# Patient Record
Sex: Female | Born: 1970 | Race: White | Hispanic: No | Smoking: Never smoker
Health system: Southern US, Community
[De-identification: ages and names within clinical notes are randomized; demographics above are authoritative.]

## PROBLEM LIST (undated history)

## (undated) DIAGNOSIS — F419 Anxiety disorder, unspecified: Secondary | ICD-10-CM

## (undated) DIAGNOSIS — F329 Major depressive disorder, single episode, unspecified: Secondary | ICD-10-CM

## (undated) DIAGNOSIS — N809 Endometriosis, unspecified: Secondary | ICD-10-CM

## (undated) DIAGNOSIS — F32A Depression, unspecified: Secondary | ICD-10-CM

## (undated) HISTORY — PX: PELVIC LAPAROSCOPY: SHX162

## (undated) HISTORY — DX: Anxiety disorder, unspecified: F41.9

## (undated) HISTORY — PX: TONSILLECTOMY AND ADENOIDECTOMY: SHX28

## (undated) HISTORY — DX: Depression, unspecified: F32.A

## (undated) HISTORY — DX: Endometriosis, unspecified: N80.9

## (undated) HISTORY — DX: Major depressive disorder, single episode, unspecified: F32.9

---

## 1998-04-16 ENCOUNTER — Other Ambulatory Visit: Admission: RE | Admit: 1998-04-16 | Discharge: 1998-04-16 | Payer: Self-pay | Admitting: Gynecology

## 1998-05-12 ENCOUNTER — Inpatient Hospital Stay (HOSPITAL_COMMUNITY): Admission: AD | Admit: 1998-05-12 | Discharge: 1998-05-12 | Payer: Self-pay | Admitting: Obstetrics and Gynecology

## 1998-05-14 ENCOUNTER — Encounter: Admission: RE | Admit: 1998-05-14 | Discharge: 1998-08-12 | Payer: Self-pay | Admitting: Gynecology

## 1998-08-12 ENCOUNTER — Other Ambulatory Visit: Admission: RE | Admit: 1998-08-12 | Discharge: 1998-08-12 | Payer: Self-pay | Admitting: Gynecology

## 1998-09-18 ENCOUNTER — Inpatient Hospital Stay (HOSPITAL_COMMUNITY): Admission: AD | Admit: 1998-09-18 | Discharge: 1998-09-18 | Payer: Self-pay | Admitting: Obstetrics and Gynecology

## 1998-10-23 ENCOUNTER — Inpatient Hospital Stay (HOSPITAL_COMMUNITY): Admission: AD | Admit: 1998-10-23 | Discharge: 1998-10-23 | Payer: Self-pay | Admitting: Obstetrics and Gynecology

## 1998-10-26 ENCOUNTER — Inpatient Hospital Stay (HOSPITAL_COMMUNITY): Admission: AD | Admit: 1998-10-26 | Discharge: 1998-10-28 | Payer: Self-pay | Admitting: Gynecology

## 1998-12-23 ENCOUNTER — Other Ambulatory Visit: Admission: RE | Admit: 1998-12-23 | Discharge: 1998-12-23 | Payer: Self-pay | Admitting: Gynecology

## 1999-12-25 ENCOUNTER — Other Ambulatory Visit: Admission: RE | Admit: 1999-12-25 | Discharge: 1999-12-25 | Payer: Self-pay | Admitting: Obstetrics and Gynecology

## 2001-05-17 ENCOUNTER — Other Ambulatory Visit: Admission: RE | Admit: 2001-05-17 | Discharge: 2001-05-17 | Payer: Self-pay | Admitting: Obstetrics and Gynecology

## 2002-06-26 ENCOUNTER — Other Ambulatory Visit: Admission: RE | Admit: 2002-06-26 | Discharge: 2002-06-26 | Payer: Self-pay | Admitting: Obstetrics and Gynecology

## 2003-12-12 ENCOUNTER — Other Ambulatory Visit: Admission: RE | Admit: 2003-12-12 | Discharge: 2003-12-12 | Payer: Self-pay | Admitting: Obstetrics and Gynecology

## 2007-06-30 ENCOUNTER — Other Ambulatory Visit: Admission: RE | Admit: 2007-06-30 | Discharge: 2007-06-30 | Payer: Self-pay | Admitting: Obstetrics and Gynecology

## 2013-11-13 ENCOUNTER — Ambulatory Visit (INDEPENDENT_AMBULATORY_CARE_PROVIDER_SITE_OTHER): Payer: BC Managed Care – PPO | Admitting: Women's Health

## 2013-11-13 ENCOUNTER — Encounter: Payer: Self-pay | Admitting: Women's Health

## 2013-11-13 DIAGNOSIS — Z113 Encounter for screening for infections with a predominantly sexual mode of transmission: Secondary | ICD-10-CM

## 2013-11-13 DIAGNOSIS — A499 Bacterial infection, unspecified: Secondary | ICD-10-CM

## 2013-11-13 DIAGNOSIS — B9689 Other specified bacterial agents as the cause of diseases classified elsewhere: Secondary | ICD-10-CM

## 2013-11-13 DIAGNOSIS — N760 Acute vaginitis: Secondary | ICD-10-CM

## 2013-11-13 LAB — WET PREP FOR TRICH, YEAST, CLUE
TRICH WET PREP: NONE SEEN
YEAST WET PREP: NONE SEEN

## 2013-11-13 MED ORDER — METRONIDAZOLE 0.75 % VA GEL: VAGINAL | Status: DC

## 2013-11-13 NOTE — Patient Instructions (Signed)
Bacterial Vaginosis Bacterial vaginosis is an infection of the vagina. It happens when too many of certain germs (bacteria) grow in the vagina. HOME CARE  Take your medicine as told by your doctor.  Finish your medicine even if you start to feel better.  Do not have sex until you finish your medicine and are better.  Tell your sex partner that you have an infection. They should see their doctor for treatment.  Practice safe sex. Use condoms. Have only one sex partner. GET HELP IF:  You are not getting better after 3 days of treatment.  You have more grey fluid (discharge) coming from your vagina than before.  You have more pain than before.  You have a fever. MAKE SURE YOU:   Understand these instructions.  Will watch your condition.  Will get help right away if you are not doing well or get worse. Document Released: 06/01/2008 Document Revised: 06/13/2013 Document Reviewed: 04/04/2013 Exit[employer] Patient Information 2014 [employer].  

## 2013-11-13 NOTE — Progress Notes (Signed)
Patient ID: Lori Serrano, female   DOB: 12-26-1970, 43 y.o.   MRN: [id number] Presents with complaint of painful bump on right inner labia draining bloody material. States had one several weeks ago that resolved on its own. Questions if it's a Bartholin's cyst. One partner greater than 20 years, brief separation, denies abuse or infidelity. Denies urinary symptoms, abdominal pain or fever. Reports normal Pap history, last Pap greater than 3 years ago. Has a  scheduled mammogram and annual exam next week.  Exam: Appears well. External genitalia slightly erythematous, right inner labia folliculitis draining serous sanguinous drainage no odor noted. Speculum exam moderate amount of adherent white discharge with odor noted. Wet prep positive for amines, clues, TNTC bacteria. GC/Chlamydia culture taken. Bimanual no CMT or adnexal fullness or tenderness.  Bacteria vaginosis Resolving folliculitis  Plan: MetroGel vaginal cream 1 applicator at bedtime x5, alcohol precautions reviewed. Loose clothing, soak in a warm tub, topical antibiotic cream twice daily to affected area. Keep scheduled mammogram and annual exam appointment, will check HIV, hepatitis and RPR. Encouraged marriage counseling, Sonda Primes name and number given.

## 2013-11-14 NOTE — Addendum Note (Signed)
Addended by: Su Grand A on: 11/14/2013 08:39 AM   Modules accepted: Orders

## 2013-11-15 LAB — GC/CHLAMYDIA PROBE AMP
CT PROBE, AMP APTIMA: NEGATIVE
GC Probe RNA: NEGATIVE

## 2013-11-23 ENCOUNTER — Encounter: Payer: Self-pay | Admitting: Women's Health

## 2013-11-23 ENCOUNTER — Ambulatory Visit (INDEPENDENT_AMBULATORY_CARE_PROVIDER_SITE_OTHER): Payer: BC Managed Care – PPO | Admitting: Women's Health

## 2013-11-23 ENCOUNTER — Other Ambulatory Visit (HOSPITAL_COMMUNITY)
Admission: RE | Admit: 2013-11-23 | Discharge: 2013-11-23 | Disposition: A | Discharge status: Home / Self Care | Payer: BC Managed Care – PPO | Source: Ambulatory Visit | Attending: Women's Health | Admitting: Women's Health

## 2013-11-23 VITALS — BP 124/80 | Ht 65.0 in | Wt 156.4 lb

## 2013-11-23 DIAGNOSIS — N83209 Unspecified ovarian cyst, unspecified side: Secondary | ICD-10-CM

## 2013-11-23 DIAGNOSIS — Z833 Family history of diabetes mellitus: Secondary | ICD-10-CM

## 2013-11-23 DIAGNOSIS — N809 Endometriosis, unspecified: Secondary | ICD-10-CM | POA: Insufficient documentation

## 2013-11-23 DIAGNOSIS — E079 Disorder of thyroid, unspecified: Secondary | ICD-10-CM

## 2013-11-23 DIAGNOSIS — Z1151 Encounter for screening for human papillomavirus (HPV): Secondary | ICD-10-CM | POA: Insufficient documentation

## 2013-11-23 DIAGNOSIS — Z01419 Encounter for gynecological examination (general) (routine) without abnormal findings: Secondary | ICD-10-CM | POA: Insufficient documentation

## 2013-11-23 DIAGNOSIS — Z13220 Encounter for screening for lipoid disorders: Secondary | ICD-10-CM

## 2013-11-23 LAB — LIPID PANEL
Cholesterol: 185 mg/dL (ref 0–200)
HDL: 42 mg/dL (ref >39)
LDL CALC: 114 mg/dL — AB (ref 0–99)
Total CHOL/HDL Ratio: 4.4 Ratio
Triglycerides: 147 mg/dL (ref <150)
VLDL: 29 mg/dL (ref 0–40)

## 2013-11-23 LAB — CBC WITH DIFFERENTIAL/PLATELET
BASOS PCT: 1 % (ref 0–1)
Basophils Absolute: 0.1 10*3/uL (ref 0.0–0.1)
EOS ABS: 0.1 10*3/uL (ref 0.0–0.7)
EOS PCT: 1 % (ref 0–5)
HEMATOCRIT: 38.7 % (ref 36.0–46.0)
Hemoglobin: 13.8 g/dL (ref 12.0–15.0)
Lymphocytes Relative: 36 % (ref 12–46)
Lymphs Abs: 2.9 10*3/uL (ref 0.7–4.0)
MCH: 29.7 pg (ref 26.0–34.0)
MCHC: 35.7 g/dL (ref 30.0–36.0)
MCV: 83.2 fL (ref 78.0–100.0)
MONO ABS: 0.6 10*3/uL (ref 0.1–1.0)
MONOS PCT: 8 % (ref 3–12)
Neutro Abs: 4.3 10*3/uL (ref 1.7–7.7)
Neutrophils Relative %: 54 % (ref 43–77)
Platelets: 363 10*3/uL (ref 150–400)
RBC: 4.65 MIL/uL (ref 3.87–5.11)
RDW: 13.5 % (ref 11.5–15.5)
WBC: 8 10*3/uL (ref 4.0–10.5)

## 2013-11-23 LAB — GLUCOSE, RANDOM: GLUCOSE: 89 mg/dL (ref 70–99)

## 2013-11-23 NOTE — Patient Instructions (Addendum)
Health Maintenance, Female A healthy lifestyle and preventative care can promote health and wellness.  Maintain regular health, dental, and eye exams.  Eat a healthy diet. Foods like vegetables, fruits, whole grains, low-fat dairy products, and lean protein foods contain the nutrients you need without too many calories. Decrease your intake of foods high in solid fats, added sugars, and salt. Get information about a proper diet from your caregiver, if necessary.  Regular physical exercise is one of the most important things you can do for your health. Most adults should get at least 150 minutes of moderate-intensity exercise (any activity that increases your heart rate and causes you to sweat) each week. In addition, most adults need muscle-strengthening exercises on 2 or more days a week.   Maintain a healthy weight. The body mass index (BMI) is a screening tool to identify possible weight problems. It provides an estimate of body fat based on height and weight. Your caregiver can help determine your BMI, and can help you achieve or maintain a healthy weight. For adults 20 years and older:  A BMI below 18.5 is considered underweight.  A BMI of 18.5 to 24.9 is normal.  A BMI of 25 to 29.9 is considered overweight.  A BMI of 30 and above is considered obese.  Maintain normal blood lipids and cholesterol by exercising and minimizing your intake of saturated fat. Eat a balanced diet with plenty of fruits and vegetables. Blood tests for lipids and cholesterol should begin at age 20 and be repeated every 5 years. If your lipid or cholesterol levels are high, you are over 50, or you are a high risk for heart disease, you may need your cholesterol levels checked more frequently.Ongoing high lipid and cholesterol levels should be treated with medicines if diet and exercise are not effective.  If you smoke, find out from your caregiver how to quit. If you do not use tobacco, do not start.  Lung  cancer screening is recommended for adults aged 55 80 years who are at high risk for developing lung cancer because of a history of smoking. Yearly low-dose computed tomography (CT) is recommended for people who have at least a 30-pack-year history of smoking and are a current smoker or have quit within the past 15 years. A pack year of smoking is smoking an average of 1 pack of cigarettes a day for 1 year (for example: 1 pack a day for 30 years or 2 packs a day for 15 years). Yearly screening should continue until the smoker has stopped smoking for at least 15 years. Yearly screening should also be stopped for people who develop a health problem that would prevent them from having lung cancer treatment.  If you are pregnant, do not drink alcohol. If you are breastfeeding, be very cautious about drinking alcohol. If you are not pregnant and choose to drink alcohol, do not exceed 1 drink per day. One drink is considered to be 12 ounces (355 mL) of beer, 5 ounces (148 mL) of wine, or 1.5 ounces (44 mL) of liquor.  Avoid use of street drugs. Do not share needles with anyone. Ask for help if you need support or instructions about stopping the use of drugs.  High blood pressure causes heart disease and increases the risk of stroke. Blood pressure should be checked at least every 1 to 2 years. Ongoing high blood pressure should be treated with medicines, if weight loss and exercise are not effective.  If you are 55 to   44 years old, ask your caregiver if you should take aspirin to prevent strokes.  Diabetes screening involves taking a blood sample to check your fasting blood sugar level. This should be done once every 3 years, after age 45, if you are within normal weight and without risk factors for diabetes. Testing should be considered at a younger age or be carried out more frequently if you are overweight and have at least 1 risk factor for diabetes.  Breast cancer screening is essential preventative care  for women. You should practice "breast self-awareness." This means understanding the normal appearance and feel of your breasts and may include breast self-examination. Any changes detected, no matter how small, should be reported to a caregiver. Women in their 20s and 30s should have a clinical breast exam (CBE) by a caregiver as part of a regular health exam every 1 to 3 years. After age 40, women should have a CBE every year. Starting at age 40, women should consider having a mammogram (breast X-ray) every year. Women who have a family history of breast cancer should talk to their caregiver about genetic screening. Women at a high risk of breast cancer should talk to their caregiver about having an MRI and a mammogram every year.  Breast cancer gene (BRCA)-related cancer risk assessment is recommended for women who have family members with BRCA-related cancers. BRCA-related cancers include breast, ovarian, tubal, and peritoneal cancers. Having family members with these cancers may be associated with an increased risk for harmful changes (mutations) in the breast cancer genes BRCA1 and BRCA2. Results of the assessment will determine the need for genetic counseling and BRCA1 and BRCA2 testing.  The Pap test is a screening test for cervical cancer. Women should have a Pap test starting at age 21. Between ages 21 and 29, Pap tests should be repeated every 2 years. Beginning at age 30, you should have a Pap test every 3 years as long as the past 3 Pap tests have been normal. If you had a hysterectomy for a problem that was not cancer or a condition that could lead to cancer, then you no longer need Pap tests. If you are between ages 65 and 70, and you have had normal Pap tests going back 10 years, you no longer need Pap tests. If you have had past treatment for cervical cancer or a condition that could lead to cancer, you need Pap tests and screening for cancer for at least 20 years after your treatment. If Pap  tests have been discontinued, risk factors (such as a new sexual partner) need to be reassessed to determine if screening should be resumed. Some women have medical problems that increase the chance of getting cervical cancer. In these cases, your caregiver may recommend more frequent screening and Pap tests.  The human papillomavirus (HPV) test is an additional test that may be used for cervical cancer screening. The HPV test looks for the virus that can cause the cell changes on the cervix. The cells collected during the Pap test can be tested for HPV. The HPV test could be used to screen women aged 30 years and older, and should be used in women of any age who have unclear Pap test results. After the age of 30, women should have HPV testing at the same frequency as a Pap test.  Colorectal cancer can be detected and often prevented. Most routine colorectal cancer screening begins at the age of 50 and continues through age 75. However, your caregiver   may recommend screening at an earlier age if you have risk factors for colon cancer. On a yearly basis, your caregiver may provide home test kits to check for hidden blood in the stool. Use of a small camera at the end of a tube, to directly examine the colon (sigmoidoscopy or colonoscopy), can detect the earliest forms of colorectal cancer. Talk to your caregiver about this at age 62, when routine screening begins. Direct examination of the colon should be repeated every 5 to 10 years through age 37, unless early forms of pre-cancerous polyps or small growths are found.  Hepatitis C blood testing is recommended for all people born from [birth year] through 1965 and any individual with known risks for hepatitis C.  Practice safe sex. Use condoms and avoid high-risk sexual practices to reduce the spread of sexually transmitted infections (STIs). Sexually active women aged 69 and younger should be checked for Chlamydia, which is a common sexually transmitted infection.  Older women with new or multiple partners should also be tested for Chlamydia. Testing for other STIs is recommended if you are sexually active and at increased risk.  Osteoporosis is a disease in which the bones lose minerals and strength with aging. This can result in serious bone fractures. The risk of osteoporosis can be identified using a bone density scan. Women ages 66 and over and women at risk for fractures or osteoporosis should discuss screening with their caregivers. Ask your caregiver whether you should be taking a calcium supplement or vitamin D to reduce the rate of osteoporosis.  Menopause can be associated with physical symptoms and risks. Hormone replacement therapy is available to decrease symptoms and risks. You should talk to your caregiver about whether hormone replacement therapy is right for you.  Use sunscreen. Apply sunscreen liberally and repeatedly throughout the day. You should seek shade when your shadow is shorter than you. Protect yourself by wearing long sleeves, pants, a wide-brimmed hat, and sunglasses year round, whenever you are outdoors.  Notify your caregiver of new moles or changes in moles, especially if there is a change in shape or color. Also notify your caregiver if a mole is larger than the size of a pencil eraser.  Stay current with your immunizations. Document Released: 03/08/2011 Document Revised: 12/18/2012 Document Reviewed: 03/08/2011 [facility] Patient Information 2014 [place], [place]. Pregnancy If you are planning on getting pregnant, it is a good idea to make a preconception appointment with your caregiver to discuss having a healthy lifestyle before getting pregnant. This includes diet, weight, exercise, taking prenatal vitamins (especially folic acid, which helps prevent brain and spinal cord defects), avoiding alcohol, smoking and illegal drugs, medical problems (diabetes, convulsions), family history of genetic problems, working conditions, and  immunizations. It is better to have knowledge of these things and do something about them before getting pregnant. During your pregnancy, it is important to follow certain guidelines in order to have a healthy baby. It is very important to get good prenatal care and follow your caregiver's instructions. Prenatal care includes all the medical care you receive before your baby's birth. This helps to prevent problems during the pregnancy and childbirth. HOME CARE INSTRUCTIONS   Start your prenatal visits by the 12th week of pregnancy or earlier, if possible. At first, appointments are usually scheduled monthly. They become more frequent in the last 2 months before delivery. It is important that you keep your caregiver's appointments and follow your caregiver's instructions regarding medication use, exercise, and diet.  During pregnancy, you  are providing food for you and your baby. Eat a regular, well-balanced diet. Choose foods such as meat, fish, milk and other dairy products, vegetables, fruits, whole-grain breads and cereals. Your caregiver will inform you of the ideal weight gain depending on your current height and weight. Drink lots of liquids. Try to drink 8 glasses of water a day.  Alcohol is associated with a number of birth defects including fetal alcohol syndrome. It is best to avoid alcohol completely. Smoking will cause low birth rate and prematurity. Use of alcohol and nicotine during your pregnancy also increases the chances that your child will be chemically dependent later in their life and may contribute to SIDS (Sudden Infant Death Syndrome).  Do not use illegal drugs.  Only take prescription or over-the-counter medications that are recommended by your caregiver. Other medications can cause genetic and physical problems in the baby.  Morning sickness can often be helped by keeping soda crackers at the bedside. Eat a few before getting up in the morning.  A sexual relationship may be  continued until near the end of pregnancy if there are no other problems such as early (premature) leaking of amniotic fluid from the membranes, vaginal bleeding, painful intercourse or belly (abdominal) pain.  Exercise regularly. Check with your caregiver if you are unsure of the safety of some of your exercises.  Do not use hot tubs, steam rooms or saunas. These increase the risk of fainting and hurting yourself and the baby. Swimming is OK for exercise. Get plenty of rest, including afternoon naps when possible, especially in the third trimester.  Avoid toxic odors and chemicals.  Do not wear high heels. They may cause you to lose your balance and fall.  Do not lift over 5 pounds. If you do lift anything, lift with your legs and thighs, not your back.  Avoid long trips, especially in the third trimester.  If you have to travel out of the city or state, take a copy of your medical records with you. SEEK IMMEDIATE MEDICAL CARE IF:   You develop an unexplained oral temperature above 102 F (38.9 C), or as your caregiver suggests.  You have leaking of fluid from the vagina. If leaking membranes are suspected, take your temperature and inform your caregiver of this when you call.  There is vaginal spotting or bleeding. Notify your caregiver of the amount and how many pads are used.  You continue to feel sick to your stomach (nauseous) and have no relief from remedies suggested, or you throw up (vomit) blood or coffee ground like materials.  You develop upper abdominal pain.  You have round ligament discomfort in the lower abdominal area. This still must be evaluated by your caregiver.  You feel contractions of the uterus.  You do not feel the baby move, or there is less movement than before.  You have painful urination.  You have abnormal vaginal discharge.  You have persistent diarrhea.  You get a severe headache.  You have problems with your vision.  You develop muscle  weakness.  You feel dizzy and faint.  You develop shortness of breath.  You develop chest pain.  You have back pain that travels down to your leg and feet.  You feel irregular or a very fast heartbeat.  You develop excessive weight gain in a short period of time (5 pounds in 3 to 5 days).  You are involved in a domestic violence situation. Document Released: 08/23/2005 Document Revised: 02/22/2012 Document Reviewed: 02/14/2009  10/04/2011 °ExitCare® Patient Information ©2014 [employer]. ° °

## 2013-11-23 NOTE — Progress Notes (Signed)
Lori Serrano 24-Mar-1971 [id number]    History:    Presents for annual exam.  Regular monthly cycle using no contraception history of infertility. Normal Pap history. Has had some intermittent right breast pain and low abdominal discomfort over the past year. Breast cancer-Sister age 44, BRCA status unknown, maternal and paternal grandmother after menopause.  Laparoscopic surgery 89, 89, and 89 for endometriosis and endometrioma.  Past medical history, past surgical history, family history and social history were all reviewed and documented in the EPIC chart. 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 Lori Serrano 65, 75 yo daughter both doing well. Parents died of heart disease early 52s, father hypertension and smoker.  ROS:  A  ROS was performed and pertinent positives and negatives are included.  Exam:  Filed Vitals:   11/23/13 1155  BP: 124/80    General appearance:  Normal Thyroid:  Symmetrical, normal in size, without palpable masses or nodularity. Respiratory  Auscultation:  Clear without wheezing or rhonchi Cardiovascular  Auscultation:  Regular rate, without rubs, murmurs or gallops  Edema/varicosities:  Not grossly evident Abdominal  Soft,nontender, without masses, guarding or rebound.  Liver/spleen:  No organomegaly noted  Hernia:  None appreciated  Skin  Inspection:  Grossly normal   Breasts: Examined lying and sitting.     Right: Without masses, retractions, discharge or axillary adenopathy.     Left: Without masses, retractions, discharge or axillary adenopathy. Gentitourinary   Inguinal/mons:  Normal without inguinal adenopathy  External genitalia:  Normal  BUS/Urethra/Skene's glands:  Normal  Vagina:  Normal  Cervix:  Normal  Uterus:  normal in size, shape and contour.  Midline and mobile  Adnexa/parametria:     Rt: Without masses or tenderness.   Lt: Without masses or tenderness.  Anus and perineum: Normal  Digital rectal exam: Normal sphincter tone without palpated masses  or tenderness  Assessment/Plan:  43 y.o. MWF G2P2 for annual exam.    Intermittent right breast pain  History of endometriosis and ovarian cysts having intermittent lower abdominal intermittent pain   Plan: Schedule ultrasound after next cycle. SBE's, schedule screening annual 3D tomography mammogram. Reviewed importance of annual screen. Increase regular exercise and decrease calories for weight loss. Calcium rich diet, vitamin D 1000 daily encouraged. Contraception reviewed and declined. CBC, lipid panel, glucose, TSH, UA, Pap with HR HPV typing. New Pap screening guidelines reviewed.    Huel Cote [facility], 12:52 PM 11/23/2013

## 2013-11-24 LAB — URINALYSIS W MICROSCOPIC + REFLEX CULTURE
Bacteria, UA: NONE SEEN
Bilirubin Urine: NEGATIVE
Casts: NONE SEEN
Crystals: NONE SEEN
GLUCOSE, UA: NEGATIVE mg/dL
Ketones, ur: NEGATIVE mg/dL
Leukocytes, UA: NEGATIVE
Nitrite: NEGATIVE
PROTEIN: NEGATIVE mg/dL
Specific Gravity, Urine: 1.013 (ref 1.005–1.030)
Urobilinogen, UA: 0.2 mg/dL (ref 0.0–1.0)
pH: 7.5 (ref 5.0–8.0)

## 2013-11-24 LAB — TSH: TSH: 1.867 u[IU]/mL (ref 0.350–4.500)

## 2013-11-26 ENCOUNTER — Encounter: Payer: Self-pay | Admitting: Obstetrics and Gynecology

## 2013-11-26 LAB — URINE CULTURE: Colony Count: 70000

## 2013-11-27 ENCOUNTER — Other Ambulatory Visit: Payer: Self-pay

## 2013-11-27 DIAGNOSIS — Z1231 Encounter for screening mammogram for malignant neoplasm of breast: Secondary | ICD-10-CM

## 2013-12-10 ENCOUNTER — Ambulatory Visit
Admission: RE | Admit: 2013-12-10 | Discharge: 2013-12-10 | Disposition: A | Discharge status: Home / Self Care | Payer: BC Managed Care – PPO | Source: Ambulatory Visit

## 2013-12-10 DIAGNOSIS — Z1231 Encounter for screening mammogram for malignant neoplasm of breast: Secondary | ICD-10-CM

## 2013-12-11 ENCOUNTER — Telehealth: Payer: Self-pay | Admitting: *Deleted

## 2013-12-11 NOTE — Telephone Encounter (Signed)
Please call and inform Pap was normal with negative HR HPV. Positive for yeast. Please call in Diflucan 150 mg times one dose.

## 2013-12-11 NOTE — Telephone Encounter (Signed)
Pt called requesting pap results from 11/23/13, see pap note, should patient have Rx? Please advise

## 2013-12-11 NOTE — Telephone Encounter (Signed)
Left message to call.

## 2013-12-13 MED ORDER — FLUCONAZOLE 150 MG PO TABS: 150.0000 mg | ORAL_TABLET | Freq: Once | ORAL | Status: DC

## 2013-12-13 NOTE — Telephone Encounter (Signed)
Patient informed. Rx sent 

## 2013-12-13 NOTE — Telephone Encounter (Signed)
Message left

## 2013-12-13 NOTE — Telephone Encounter (Signed)
Patient asked about mammo result from 12/10/13.  I told her it was negative/follow up one year.  She said when she was in she told you about pain she has been having in her right breast and up into her armpit.  She is asking is there anything else you can recommend she do to continue exploring what is causing this pain?

## 2013-12-25 NOTE — Telephone Encounter (Signed)
Telephone call, states continues to have pain in the right breast area that radiates to the armpit, states pain in collarbone area. Reviewed importance to followup with primary care, history of gallstones.

## 2014-02-28 ENCOUNTER — Encounter: Payer: Self-pay | Admitting: Gynecology

## 2014-02-28 ENCOUNTER — Ambulatory Visit (INDEPENDENT_AMBULATORY_CARE_PROVIDER_SITE_OTHER): Payer: BC Managed Care – PPO | Admitting: Gynecology

## 2014-02-28 DIAGNOSIS — Z113 Encounter for screening for infections with a predominantly sexual mode of transmission: Secondary | ICD-10-CM

## 2014-02-28 LAB — RPR

## 2014-02-28 LAB — WET PREP FOR TRICH, YEAST, CLUE
Clue Cells Wet Prep HPF POC: NONE SEEN
Trich, Wet Prep: NONE SEEN
YEAST WET PREP: NONE SEEN

## 2014-02-28 LAB — HIV ANTIBODY (ROUTINE TESTING W REFLEX): HIV 1&2 Ab, 4th Generation: NONREACTIVE

## 2014-02-28 MED ORDER — ALPRAZOLAM 0.5 MG PO TABS: 0.5000 mg | ORAL_TABLET | Freq: Every evening | ORAL | Status: DC | PRN

## 2014-02-28 NOTE — Patient Instructions (Addendum)
Office will call you with the test results. Followup for repeat blood studies in 3-6 months.

## 2014-02-28 NOTE — Progress Notes (Signed)
Lori Serrano [birth year] [id number]        43 y.o.  G2P2 status for STD screening. Found out yesterday that her husband has been unfaithful. Potential that his partner used methamphetamine. [name] last intercourse with her husband was a week ago. Was recently screened by Lori Serrano for [facility] and Chlamydia when she was suspecting in April but confirmed the situation yesterday. Is having some vaginal irritation. No significant discharge or discomfort.  Past medical history,surgical history, problem list, medications, allergies, family history and social history were all reviewed and documented in the EPIC chart.  Directed ROS with pertinent positives and negatives documented in the history of present illness/assessment and plan.  Exam: Lori Serrano assistant General appearance  Normal External BUS vagina normal. Cervix normal. Uterus normal sized mobile nontender. Adnexa without masses or tenderness.  Assessment/Plan:  43 y.o. G2P2 for STD screening. GC/Chlamydia, wet prep, RPR, HIV, hepatitis B, hepatitis C done. Wet prep is negative. Reviewed latency as far as serum conversion and recommended repeat blood studies in 3-6 months. Issues with HPV and HSV reviewed. Xanax 0.5 mg #30 provided for anxiety. One refill. Followup on lab results and repeat blood studies in 3-6 months.   Note: This document was prepared with digital dictation and possible smart phrase technology. Any transcriptional errors that result from this process are unintentional.   Lori Auerbach MD, 12:16 PM 02/28/2014

## 2014-02-28 NOTE — Addendum Note (Signed)
Addended by: Anastasio Auerbach on: 02/28/2014 12:28 PM   Modules accepted: Orders

## 2014-03-01 LAB — URINALYSIS W MICROSCOPIC + REFLEX CULTURE
BACTERIA UA: NONE SEEN
Bilirubin Urine: NEGATIVE
Casts: NONE SEEN
Glucose, UA: NEGATIVE mg/dL
Hgb urine dipstick: NEGATIVE
KETONES UR: NEGATIVE mg/dL
Leukocytes, UA: NEGATIVE
NITRITE: NEGATIVE
PROTEIN: NEGATIVE mg/dL
Specific Gravity, Urine: 1.028 (ref 1.005–1.030)
UROBILINOGEN UA: 0.2 mg/dL (ref 0.0–1.0)
pH: 6 (ref 5.0–8.0)

## 2014-03-01 LAB — GC/CHLAMYDIA PROBE AMP
CT Probe RNA: NEGATIVE
GC Probe RNA: NEGATIVE

## 2014-03-01 LAB — HEPATITIS B SURFACE ANTIGEN: Hepatitis B Surface Ag: NEGATIVE

## 2014-03-01 LAB — HEPATITIS C ANTIBODY: HCV AB: NEGATIVE

## 2014-05-31 ENCOUNTER — Other Ambulatory Visit: Payer: Self-pay | Admitting: Gynecology

## 2014-05-31 ENCOUNTER — Encounter: Payer: Self-pay | Admitting: Gynecology

## 2014-05-31 ENCOUNTER — Ambulatory Visit (INDEPENDENT_AMBULATORY_CARE_PROVIDER_SITE_OTHER): Payer: BC Managed Care – PPO | Admitting: Gynecology

## 2014-05-31 DIAGNOSIS — Z113 Encounter for screening for infections with a predominantly sexual mode of transmission: Secondary | ICD-10-CM

## 2014-05-31 DIAGNOSIS — R350 Frequency of micturition: Secondary | ICD-10-CM

## 2014-05-31 LAB — URINALYSIS W MICROSCOPIC + REFLEX CULTURE
Bilirubin Urine: NEGATIVE
GLUCOSE, UA: NEGATIVE mg/dL
HGB URINE DIPSTICK: NEGATIVE
Ketones, ur: NEGATIVE mg/dL
Leukocytes, UA: NEGATIVE
NITRITE: NEGATIVE
PH: 8.5 — AB (ref 5.0–8.0)
Protein, ur: NEGATIVE mg/dL
Specific Gravity, Urine: 1.015 (ref 1.005–1.030)
Urobilinogen, UA: 0.2 mg/dL (ref 0.0–1.0)

## 2014-05-31 NOTE — Patient Instructions (Addendum)
Office will contact you with lab results.  Call if your urinary symptoms persist or worsen.

## 2014-05-31 NOTE — Progress Notes (Addendum)
Lori Serrano May 09, 1971 [id number]        43 y.o.  G2P2 Presents complaining of slight urinary frequency. Not sure if she is not coming down with urinary tract infection. Is not having dysuria or urgency low back pain fever or chills. Also wants retested for viral STDs as it has been 4 months since exposure risk.  Past medical history,surgical history, problem list, medications, allergies, family history and social history were all reviewed and documented in the EPIC chart.  Directed ROS with pertinent positives and negatives documented in the history of present illness/assessment and plan.  Exam: Spine straight without CVA tenderness.  Abdomen soft nontender without masses guarding rebound organomegaly  Assessment/Plan:  44 y.o. G2P2 with:  1. Slight urinary frequency. Urinalysis is totally negative. No other symptoms to suggest UTI. Recommend pushing fluids over the next day or 2. If symptoms resolve and monitor. If they persist/worsening call and we'll consider antibiotic coverage. 2. STD screening. HIV RPR hepatitis B hepatitis C ordered. Patient had a lot of questions about herpes. Requests herpes serum screening. I reviewed the pitfalls to include cross reactivity with cold sores and more than likely will not given exposure timeframe.  Patient understands and accepts this and HSV 1 and 2 IgM IgG ordered. Patient will follow up for results otherwise assuming all negative then routine follow up     Anastasio Auerbach MD, 3:07 PM 05/31/2014

## 2014-06-01 LAB — HIV ANTIBODY (ROUTINE TESTING W REFLEX): HIV: NONREACTIVE

## 2014-06-01 LAB — RPR

## 2014-06-01 LAB — HEPATITIS B SURFACE ANTIGEN: Hepatitis B Surface Ag: NEGATIVE

## 2014-06-01 LAB — HEPATITIS C ANTIBODY: HCV Ab: NEGATIVE

## 2014-06-02 LAB — URINE CULTURE
Colony Count: NO GROWTH
ORGANISM ID, BACTERIA: NO GROWTH

## 2014-06-03 ENCOUNTER — Telehealth: Payer: Self-pay | Admitting: *Deleted

## 2014-06-03 LAB — URINALYSIS W MICROSCOPIC + REFLEX CULTURE

## 2014-06-03 NOTE — Telephone Encounter (Signed)
Pt called requesting lab results on 05/31/14, pt informed STD screening negative results.

## 2014-06-04 LAB — HSV(HERPES SMPLX)ABS-I+II(IGG+IGM)-BLD
HERPES SIMPLEX VRS I-IGM AB (EIA): 0.48 {index}
HSV 1 Glycoprotein G Ab, IgG: <0.1 IV

## 2014-07-08 ENCOUNTER — Encounter: Payer: Self-pay | Admitting: Gynecology

## 2014-09-03 ENCOUNTER — Telehealth: Payer: Self-pay | Admitting: *Deleted

## 2014-09-03 NOTE — Telephone Encounter (Signed)
Pt was told to have non-ob ultrasound after next cycle on OV 11/23/13. Pt said you told her ultrasound must be 5 days after cycle? Is this true? Or okay to have anytime after cycle?  Please advise

## 2014-09-03 NOTE — Telephone Encounter (Signed)
First week after cycle ends would be best.

## 2014-09-04 NOTE — Telephone Encounter (Signed)
Left message on her voice mail that best time to schedule "appointment" is week after period ends.

## 2014-09-05 ENCOUNTER — Other Ambulatory Visit: Payer: Self-pay | Admitting: Gynecology

## 2014-09-05 DIAGNOSIS — R350 Frequency of micturition: Secondary | ICD-10-CM

## 2014-09-05 DIAGNOSIS — R102 Pelvic and perineal pain: Secondary | ICD-10-CM

## 2014-09-13 ENCOUNTER — Other Ambulatory Visit: Payer: BC Managed Care – PPO

## 2014-09-13 ENCOUNTER — Ambulatory Visit: Payer: BC Managed Care – PPO | Admitting: Gynecology

## 2014-10-03 ENCOUNTER — Other Ambulatory Visit: Payer: Self-pay | Admitting: Gynecology

## 2014-10-03 DIAGNOSIS — R102 Pelvic and perineal pain: Secondary | ICD-10-CM

## 2014-10-03 DIAGNOSIS — R350 Frequency of micturition: Secondary | ICD-10-CM

## 2014-10-11 ENCOUNTER — Other Ambulatory Visit: Payer: Self-pay

## 2014-10-11 ENCOUNTER — Ambulatory Visit (INDEPENDENT_AMBULATORY_CARE_PROVIDER_SITE_OTHER): Payer: BLUE CROSS/BLUE SHIELD

## 2014-10-11 ENCOUNTER — Encounter: Payer: Self-pay | Admitting: Gynecology

## 2014-10-11 ENCOUNTER — Ambulatory Visit: Payer: Self-pay | Admitting: Gynecology

## 2014-10-11 ENCOUNTER — Ambulatory Visit (INDEPENDENT_AMBULATORY_CARE_PROVIDER_SITE_OTHER): Payer: BLUE CROSS/BLUE SHIELD | Admitting: Gynecology

## 2014-10-11 DIAGNOSIS — N809 Endometriosis, unspecified: Secondary | ICD-10-CM

## 2014-10-11 DIAGNOSIS — R350 Frequency of micturition: Secondary | ICD-10-CM

## 2014-10-11 DIAGNOSIS — D251 Intramural leiomyoma of uterus: Secondary | ICD-10-CM

## 2014-10-11 DIAGNOSIS — Z113 Encounter for screening for infections with a predominantly sexual mode of transmission: Secondary | ICD-10-CM

## 2014-10-11 DIAGNOSIS — R102 Pelvic and perineal pain: Secondary | ICD-10-CM

## 2014-10-11 LAB — HEPATITIS C ANTIBODY: HCV AB: NEGATIVE

## 2014-10-11 LAB — HIV ANTIBODY (ROUTINE TESTING W REFLEX): HIV 1&2 Ab, 4th Generation: NONREACTIVE

## 2014-10-11 LAB — HEPATITIS B SURFACE ANTIGEN: HEP B S AG: NEGATIVE

## 2014-10-11 NOTE — Progress Notes (Signed)
Lori Serrano Jun 23, 1971 [id number]        44 y.o.  G2P2 presents for ultrasound that actually was ordered by Nancy March 2015 due to history of endometriosis and lower pelvic discomfort. Patient notes that her discomfort has resolved. Is having regular menses but scheduled the ultrasound regardless. She also had questions about follow up STD screening.  Past medical history,surgical history, problem list, medications, allergies, family history and social history were all reviewed and documented in the EPIC chart.  Directed ROS with pertinent positives and negatives documented in the history of present illness/assessment and plan.  Ultrasound shows uterus normal size. 2 small myomas 21 mm an 11 mm noted. Right and left ovaries normal with small follicle left ovary. Cul-de-sac negative. Endometrial echo 11.7 mm  Assessment/Plan:  44 y.o. G2P2 with normal ultrasound other than 2 small myomas. History of endometriosis. No evidence of overt endometriomas or other pathology. Patient's discomfort has resolved. We'll continue to follow. Has annual appointment in March and will follow up for exam at that time. Asked about follow up STD screening.  Had negative GC chlamydia, HSV 1  and 2, hepatitis B, hepatitis C, RPR, HIV initially in June with follow up in September. Asked if there is no way that they could convert and I told her that to be most prudent will check at 6 month interval. She'll go ahead and have drawn today above serum screening. No need for GC chlamydia as this was negative originally.     Anastasio Auerbach MD, 4:16 PM 10/11/2014

## 2014-10-11 NOTE — Patient Instructions (Signed)
Office will call you with STD results. Follow up when you're due for your annual exam in March.

## 2014-10-12 LAB — RPR

## 2014-10-14 LAB — HSV(HERPES SIMPLEX VRS) I + II AB-IGG
HSV 1 Glycoprotein G Ab, IgG: <0.1 IV
HSV 2 Glycoprotein G Ab, IgG: <0.1 IV

## 2014-12-06 ENCOUNTER — Ambulatory Visit (INDEPENDENT_AMBULATORY_CARE_PROVIDER_SITE_OTHER): Payer: BLUE CROSS/BLUE SHIELD | Admitting: Women's Health

## 2014-12-06 ENCOUNTER — Encounter: Payer: Self-pay | Admitting: Women's Health

## 2014-12-06 VITALS — BP 124/80 | Ht 65.0 in | Wt 149.0 lb

## 2014-12-06 DIAGNOSIS — Z01419 Encounter for gynecological examination (general) (routine) without abnormal findings: Secondary | ICD-10-CM

## 2014-12-06 DIAGNOSIS — Z13220 Encounter for screening for lipoid disorders: Secondary | ICD-10-CM

## 2014-12-06 DIAGNOSIS — N3289 Other specified disorders of bladder: Secondary | ICD-10-CM

## 2014-12-06 DIAGNOSIS — N898 Other specified noninflammatory disorders of vagina: Secondary | ICD-10-CM

## 2014-12-06 DIAGNOSIS — R3989 Other symptoms and signs involving the genitourinary system: Secondary | ICD-10-CM

## 2014-12-06 LAB — COMPREHENSIVE METABOLIC PANEL
ALT: 12 U/L (ref 0–35)
AST: 16 U/L (ref 0–37)
Albumin: 4.5 g/dL (ref 3.5–5.2)
Alkaline Phosphatase: 72 U/L (ref 39–117)
BILIRUBIN TOTAL: 0.5 mg/dL (ref 0.2–1.2)
BUN: 11 mg/dL (ref 6–23)
CO2: 29 mEq/L (ref 19–32)
CREATININE: 0.59 mg/dL (ref 0.50–1.10)
Calcium: 9.6 mg/dL (ref 8.4–10.5)
Chloride: 98 mEq/L (ref 96–112)
Glucose, Bld: 75 mg/dL (ref 70–99)
Potassium: 4 mEq/L (ref 3.5–5.3)
Sodium: 138 mEq/L (ref 135–145)
Total Protein: 7.5 g/dL (ref 6.0–8.3)

## 2014-12-06 LAB — LIPID PANEL
CHOLESTEROL: 256 mg/dL — AB (ref 0–200)
HDL: 55 mg/dL (ref >=46)
LDL Cholesterol: 162 mg/dL — ABNORMAL HIGH (ref 0–99)
Total CHOL/HDL Ratio: 4.7 Ratio
Triglycerides: 196 mg/dL — ABNORMAL HIGH (ref <150)
VLDL: 39 mg/dL (ref 0–40)

## 2014-12-06 LAB — CBC WITH DIFFERENTIAL/PLATELET
BASOS ABS: 0.1 10*3/uL (ref 0.0–0.1)
Basophils Relative: 1 % (ref 0–1)
EOS PCT: 1 % (ref 0–5)
Eosinophils Absolute: 0.1 10*3/uL (ref 0.0–0.7)
HCT: 38.4 % (ref 36.0–46.0)
Hemoglobin: 12.9 g/dL (ref 12.0–15.0)
LYMPHS ABS: 2.2 10*3/uL (ref 0.7–4.0)
Lymphocytes Relative: 29 % (ref 12–46)
MCH: 29.5 pg (ref 26.0–34.0)
MCHC: 33.6 g/dL (ref 30.0–36.0)
MCV: 87.7 fL (ref 78.0–100.0)
MPV: 9 fL (ref 8.6–12.4)
Monocytes Absolute: 0.8 10*3/uL (ref 0.1–1.0)
Monocytes Relative: 10 % (ref 3–12)
NEUTROS ABS: 4.5 10*3/uL (ref 1.7–7.7)
NEUTROS PCT: 59 % (ref 43–77)
PLATELETS: 304 10*3/uL (ref 150–400)
RBC: 4.38 MIL/uL (ref 3.87–5.11)
RDW: 13.3 % (ref 11.5–15.5)
WBC: 7.7 10*3/uL (ref 4.0–10.5)

## 2014-12-06 LAB — URINALYSIS W MICROSCOPIC + REFLEX CULTURE
BILIRUBIN URINE: NEGATIVE
Glucose, UA: NEGATIVE mg/dL
Hgb urine dipstick: NEGATIVE
KETONES UR: NEGATIVE mg/dL
Leukocytes, UA: NEGATIVE
Nitrite: NEGATIVE
SPECIFIC GRAVITY, URINE: 1.025 (ref 1.005–1.030)
Urobilinogen, UA: 1 mg/dL (ref 0.0–1.0)
pH: 6 (ref 5.0–8.0)

## 2014-12-06 LAB — WET PREP FOR TRICH, YEAST, CLUE
Clue Cells Wet Prep HPF POC: NONE SEEN
Trich, Wet Prep: NONE SEEN
WBC WET PREP: NONE SEEN
Yeast Wet Prep HPF POC: NONE SEEN

## 2014-12-06 MED ORDER — SULFAMETHOXAZOLE-TRIMETHOPRIM 400-80 MG PO TABS: 1.0000 | ORAL_TABLET | Freq: Two times a day (BID) | ORAL | Status: DC

## 2014-12-06 NOTE — Progress Notes (Signed)
CADYN RODGER September 16, 1970 [id number]    History:    Presents for annual exam.  Regular monthly cycle/not sexually active greater than one year. Had a negative STD screen/husband unfaithful, process of a divorce, in counseling. Normal Pap and mammogram history. Sister. breast cancer age 45 BRCA status unknown. Maternal grandmother, paternal grandmother breast cancer after menopause. Endometriosis history, lap surgery 89, 93, 89 for endometriosis and endometrioma..  Past medical history, past surgical history, family history and social history were all reviewed and documented in the EPIC chart. 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 Aleene Davidson 44, daughter 45 both doing well. Daughter had retinoblastoma as a 62-monthold, doing well some visual impairment. Parents heart disease and hypertension.  ROS:  A ROS was performed and pertinent positives and negatives are included.  Exam:  Filed Vitals:   12/06/14 1052  BP: 124/80    General appearance:  Normal Thyroid:  Symmetrical, normal in size, without palpable masses or nodularity. Respiratory  Auscultation:  Clear without wheezing or rhonchi Cardiovascular  Auscultation:  Regular rate, without rubs, murmurs or gallops  Edema/varicosities:  Not grossly evident Abdominal  Soft,nontender, without masses, guarding or rebound.  Liver/spleen:  No organomegaly noted  Hernia:  None appreciated  Skin  Inspection:  Grossly normal   Breasts: Examined lying and sitting.     Right: Without masses, retractions, discharge or axillary adenopathy.     Left: Without masses, retractions, discharge or axillary adenopathy. Gentitourinary   Inguinal/mons:  Normal without inguinal adenopathy  External genitalia:  Normal  BUS/Urethra/Skene's glands:  Normal  Vagina:  Normal  Cervix:  Normal  Uterus:   normal in size, shape and contour.  Midline and mobile  Adnexa/parametria:     Rt: Without masses or tenderness.   Lt: Without masses or tenderness.  Anus and  perineum: Normal  Digital rectal exam: Normal sphincter tone without palpated masses or tenderness  Assessment/Plan:  44y.o. D WF G2 P2 for annual exam with continued complaint of bladder pressure with urgency and frequency.  Monthly cycle/not sexually active Endometriosis 2 small fibroids Situational stress on Zoloft and in counseling/ meds per primary care  Plan: Reviewed normality of UA, wet prep negative, negative ultrasound, Septra twice daily for 3 days #6, if no relief follow-up with GI. Reviewed possible endometriosis causing symptoms. SBE's, continue annual 3-D screening mammogram, continue regular exercise/walking, calcium rich diet, vitamin D 1000 daily encouraged. CBC, lipid panel, CMP, UA, Pap normal 2015 with negative HR HPV, new screening guidelines reviewed. Declines contraception.    YHuel CoteWHNP, 11:40 AM 12/06/2014

## 2014-12-06 NOTE — Patient Instructions (Signed)

## 2014-12-07 LAB — TSH: TSH: 1.748 u[IU]/mL (ref 0.350–4.500)

## 2014-12-13 ENCOUNTER — Telehealth: Payer: Self-pay | Admitting: *Deleted

## 2014-12-13 NOTE — Telephone Encounter (Signed)
Pt called with questions. Her husband had been unfaithful and she has had testing periodically since suspicion and confirmation of unfaithfulness. She recently had protected intercourse with him and is now worried and wants to know best time frame of when to be retested.  Please advise.  Sharrie Rothman CMA

## 2014-12-13 NOTE — Telephone Encounter (Signed)
Telephone call, reviewed most likely no problem with one time intercourse with condom, will call back to schedule STD screen.

## 2015-01-31 ENCOUNTER — Encounter: Payer: Self-pay | Admitting: Vascular Surgery

## 2015-02-04 ENCOUNTER — Other Ambulatory Visit: Payer: Self-pay | Admitting: *Deleted

## 2015-02-04 DIAGNOSIS — I83893 Varicose veins of bilateral lower extremities with other complications: Secondary | ICD-10-CM

## 2015-02-05 ENCOUNTER — Ambulatory Visit (HOSPITAL_COMMUNITY)
Admission: RE | Admit: 2015-02-05 | Discharge: 2015-02-05 | Disposition: A | Discharge status: Home / Self Care | Payer: BLUE CROSS/BLUE SHIELD | Source: Ambulatory Visit | Attending: Vascular Surgery | Admitting: Vascular Surgery

## 2015-02-05 ENCOUNTER — Encounter: Payer: Self-pay | Admitting: Vascular Surgery

## 2015-02-05 ENCOUNTER — Ambulatory Visit (INDEPENDENT_AMBULATORY_CARE_PROVIDER_SITE_OTHER): Payer: BLUE CROSS/BLUE SHIELD | Admitting: Vascular Surgery

## 2015-02-05 VITALS — BP 107/75 | HR 85 | Resp 14 | Ht 65.0 in | Wt 150.0 lb

## 2015-02-05 DIAGNOSIS — I872 Venous insufficiency (chronic) (peripheral): Secondary | ICD-10-CM

## 2015-02-05 DIAGNOSIS — I8393 Asymptomatic varicose veins of bilateral lower extremities: Secondary | ICD-10-CM | POA: Diagnosis not present

## 2015-02-05 DIAGNOSIS — I83893 Varicose veins of bilateral lower extremities with other complications: Secondary | ICD-10-CM

## 2015-02-05 NOTE — Progress Notes (Signed)
Reason for referral: Swollen L leg  History of Present Illness  Lori Serrano is a 44 y.o. (Jun 05, 1971) female who presents with chief complaint: swollen leg.  Patient notes, onset of swelling two years ago, associated no specific trigger.  [name] symptoms include: Left calf swelling as day progress, heaviness and occasional sting in anterior lower leg.  The patient has had no history of DVT, history of pregnancy, known history of varicose vein, no history of venous stasis ulcers, no history of  Lymphedema and no history of skin changes in lower legs.  There is family history of venous disorders.  The patient has used thigh high compression stockings in the past.  Past Medical History  Diagnosis Date  . Endometriosis   . Anxiety   . Depression   . Varicose veins     Past Surgical History  Procedure Laterality Date  . Pelvic laparoscopy    . Tonsillectomy and adenoidectomy      History   Social History  . Marital Status: Married    Spouse Name: N/A  . Number of Children: N/A  . Years of Education: N/A   Occupational History  . Not on file.   Social History Main Topics  . Smoking status: Never Smoker   . Smokeless tobacco: Never Used  . Alcohol Use: No  . Drug Use: No  . Sexual Activity: No     Comment: INTERCOURSE AGE 68, SEXUAL PARTNERS LESS THAN 5   Other Topics Concern  . Not on file   Social History Narrative    Family History  Problem Relation Age of Onset  . Breast cancer Sister 53  . Heart disease Mother   . Heart disease Father     Current Outpatient Prescriptions on File Prior to Visit  Medication Sig Dispense Refill  . Cholecalciferol (VITAMIN D3) 5000 UNITS CHEW Chew 1 tablet by mouth daily.     . sertraline (ZOLOFT) 25 MG tablet Take 25 mg by mouth daily.    Marland Kitchen amoxicillin (AMOXIL) 500 MG capsule Take 500 mg by mouth 3 (three) times daily.    Marland Kitchen sulfamethoxazole-trimethoprim (BACTRIM) 400-80 MG per tablet Take 1 tablet by mouth 2 (two)  times daily. (Patient not taking: Reported on 02/05/2015) 6 tablet 0   No current facility-administered medications on file prior to visit.    No Known Allergies   REVIEW OF SYSTEMS:  (Positives checked otherwise negative)  CARDIOVASCULAR:  []  chest pain, []  chest pressure, []  palpitations, []  shortness of breath when laying flat, []  shortness of breath with exertion,  [x]  pain in feet when walking, []  pain in feet when laying flat, []  history of blood clot in veins (DVT), []  history of phlebitis, []  swelling in legs, [x]  varicose veins  PULMONARY:  []  productive cough, []  asthma, []  wheezing  NEUROLOGIC:  []  weakness in arms or legs, []  numbness in arms or legs, []  difficulty speaking or slurred speech, []  temporary loss of vision in one eye, []  dizziness  HEMATOLOGIC:  []  bleeding problems, []  problems with blood clotting too easily  MUSCULOSKEL:  []  joint pain, []  joint swelling  GASTROINTEST:  []  vomiting blood, []  blood in stool     GENITOURINARY:  []  burning with urination, []  blood in urine  PSYCHIATRIC:  []  history of major depression  INTEGUMENTARY:  []  rashes, []  ulcers  CONSTITUTIONAL:  []  fever, []  chills   Physical Examination Filed Vitals:   02/05/15 1028  BP: 107/75  Pulse: 85  Resp: 14  Height: 5\' 5"  (1.651 m)  Weight: 150 lb (68.04 kg)   Body mass index is 24.96 kg/(m^2).  General: A&O x 3, WDWN  Head: [place]/AT  Ear/Nose/Throat: Hearing grossly intact, nares w/o erythema or drainage, oropharynx w/o Erythema/Exudate  Eyes: PERRLA, EOMI  Neck: Supple, no nuchal rigidity, no palpable LAD  Pulmonary: Sym exp, good air movt, CTAB, no rales, rhonchi, & wheezing  Cardiac: RRR, Nl S1, S2, no Murmurs, rubs or gallops  Vascular: Vessel Right Left  Radial Palpable Palpable  Brachial Palpable Palpable  Carotid Palpable, without bruit Palpable, without bruit  Aorta Not palpable N/A  Femoral Palpable Palpable  Popliteal Not palpable Not palpable  PT  Palpable Palpable  DP Palpable Palpable   Gastrointestinal: soft, NTND, -G/R, - HSM, - masses, - CVAT B  Musculoskeletal: M/S 5/5 throughout , Extremities without ischemic changes , L > R varicosities, no LDS, no edema  Neurologic: CN 2-12 intact , Pain and light touch intact in extremities , Motor exam as listed above  Psychiatric: Judgment intact, Mood & affect appropriate for pt's clinical situation  Dermatologic: See M/S exam for extremity exam, no rashes otherwise noted  Lymph : No Cervical, Axillary, or Inguinal lymphadenopathy    Non-Invasive Vascular Imaging  BLE Venous Insufficiency Duplex (Date: 02/05/2015):   RLE: no DVT and SVT, no GSV reflux, + deep venous reflux: CFV, + SSV reflux: mid-segment  LLE: no DVT and SVT, + GSV reflux: distal GSV, no deep venous reflux, + SSV: proximal and distal reflux   Medical Decision Making  Lori Serrano is a 44 y.o. female who presents with: BLE chronic venous insufficiency (C2).   Based on the patient's history and examination, I recommend: compressive therapy.  I discussed with the patient the use of her 20-30 mm thigh high compression stockings and need for 3 month trial of such.  Given the limited disease she has currently, she does not meet criteria for intervention.  The patient is going to follow up with if her sx worsen.  Thank you for allowing [place] to participate in this patient's care.  Adele Barthel, MD [facility] Office: [phone] Pager: [phone]  02/05/2015, 11:06 AM

## 2015-02-06 ENCOUNTER — Ambulatory Visit (INDEPENDENT_AMBULATORY_CARE_PROVIDER_SITE_OTHER): Payer: BLUE CROSS/BLUE SHIELD | Admitting: Women's Health

## 2015-02-06 ENCOUNTER — Encounter: Payer: Self-pay | Admitting: Women's Health

## 2015-02-06 VITALS — BP 118/80 | Wt 150.0 lb

## 2015-02-06 DIAGNOSIS — Z13220 Encounter for screening for lipoid disorders: Secondary | ICD-10-CM | POA: Diagnosis not present

## 2015-02-06 DIAGNOSIS — N898 Other specified noninflammatory disorders of vagina: Secondary | ICD-10-CM | POA: Diagnosis not present

## 2015-02-06 DIAGNOSIS — Z113 Encounter for screening for infections with a predominantly sexual mode of transmission: Secondary | ICD-10-CM

## 2015-02-06 LAB — LIPID PANEL
CHOLESTEROL: 201 mg/dL — AB (ref 0–200)
HDL: 44 mg/dL — AB (ref >=46)
LDL Cholesterol: 115 mg/dL — ABNORMAL HIGH (ref 0–99)
Total CHOL/HDL Ratio: 4.6 Ratio
Triglycerides: 209 mg/dL — ABNORMAL HIGH (ref <150)
VLDL: 42 mg/dL — ABNORMAL HIGH (ref 0–40)

## 2015-02-06 LAB — WET PREP FOR TRICH, YEAST, CLUE
Clue Cells Wet Prep HPF POC: NONE SEEN
Trich, Wet Prep: NONE SEEN
Yeast Wet Prep HPF POC: NONE SEEN

## 2015-02-06 NOTE — Patient Instructions (Signed)
Fat and Cholesterol Control Diet Fat and cholesterol levels in your blood and organs are influenced by your diet. High levels of fat and cholesterol may lead to diseases of the heart, small and large blood vessels, gallbladder, liver, and pancreas. CONTROLLING FAT AND CHOLESTEROL WITH DIET Although exercise and lifestyle factors are important, your diet is key. That is because certain foods are known to raise cholesterol and others to lower it. The goal is to balance foods for their effect on cholesterol and more importantly, to replace saturated and trans fat with other types of fat, such as monounsaturated fat, polyunsaturated fat, and omega-3 fatty acids. On average, a person should consume no more than 15 to 17 g of saturated fat daily. Saturated and trans fats are considered "bad" fats, and they will raise LDL cholesterol. Saturated fats are primarily found in animal products such as meats, butter, and cream. However, that does not mean you need to give up all your favorite foods. Today, there are good tasting, low-fat, low-cholesterol substitutes for most of the things you like to eat. Choose low-fat or nonfat alternatives. Choose round or loin cuts of red meat. These types of cuts are lowest in fat and cholesterol. Chicken (without the skin), fish, veal, and ground turkey breast are great choices. Eliminate fatty meats, such as hot dogs and salami. Even shellfish have little or no saturated fat. Have a 3 oz (85 g) portion when you eat lean meat, poultry, or fish. Trans fats are also called "partially hydrogenated oils." They are oils that have been scientifically manipulated so that they are solid at room temperature resulting in a longer shelf life and improved taste and texture of foods in which they are added. Trans fats are found in stick margarine, some tub margarines, cookies, crackers, and baked goods.  When baking and cooking, oils are a great substitute for butter. The monounsaturated oils are  especially beneficial since it is believed they lower LDL and raise HDL. The oils you should avoid entirely are saturated tropical oils, such as coconut and palm.  Remember to eat a lot from food groups that are naturally free of saturated and trans fat, including fish, fruit, vegetables, beans, grains (barley, rice, couscous, bulgur wheat), and pasta (without cream sauces).  IDENTIFYING FOODS THAT LOWER FAT AND CHOLESTEROL  Soluble fiber may lower your cholesterol. This type of fiber is found in fruits such as apples, vegetables such as broccoli, potatoes, and carrots, legumes such as beans, peas, and lentils, and grains such as barley. Foods fortified with plant sterols (phytosterol) may also lower cholesterol. You should eat at least 2 g per day of these foods for a cholesterol lowering effect.  Read package labels to identify low-saturated fats, trans fat free, and low-fat foods at the supermarket. Select cheeses that have only 2 to 3 g saturated fat per ounce. Use a heart-healthy tub margarine that is free of trans fats or partially hydrogenated oil. When buying baked goods (cookies, crackers), avoid partially hydrogenated oils. Breads and muffins should be made from whole grains (whole-wheat or whole oat flour, instead of "flour" or "enriched flour"). Buy non-creamy canned soups with reduced salt and no added fats.  FOOD PREPARATION TECHNIQUES  Never deep-fry. If you must fry, either stir-fry, which uses very little fat, or use non-stick cooking sprays. When possible, broil, bake, or roast meats, and steam vegetables. Instead of putting butter or margarine on vegetables, use lemon and herbs, applesauce, and cinnamon (for squash and sweet potatoes). Use nonfat   yogurt, salsa, and low-fat dressings for salads.  LOW-SATURATED FAT / LOW-FAT FOOD SUBSTITUTES Meats / Saturated Fat (g)  Avoid: Steak, marbled (3 oz/85 g) / 11 g  Choose: Steak, lean (3 oz/85 g) / 4 g  Avoid: Hamburger (3 oz/85 g) / 7  g  Choose: Hamburger, lean (3 oz/85 g) / 5 g  Avoid: Ham (3 oz/85 g) / 6 g  Choose: Ham, lean cut (3 oz/85 g) / 2.4 g  Avoid: Chicken, with skin, dark meat (3 oz/85 g) / 4 g  Choose: Chicken, skin removed, dark meat (3 oz/85 g) / 2 g  Avoid: Chicken, with skin, light meat (3 oz/85 g) / 2.5 g  Choose: Chicken, skin removed, light meat (3 oz/85 g) / 1 g Dairy / Saturated Fat (g)  Avoid: Whole milk (1 cup) / 5 g  Choose: Low-fat milk, 2% (1 cup) / 3 g  Choose: Low-fat milk, 1% (1 cup) / 1.5 g  Choose: Skim milk (1 cup) / 0.3 g  Avoid: Hard cheese (1 oz/28 g) / 6 g  Choose: Skim milk cheese (1 oz/28 g) / 2 to 3 g  Avoid: Cottage cheese, 4% fat (1 cup) / 6.5 g  Choose: Low-fat cottage cheese, 1% fat (1 cup) / 1.5 g  Avoid: Ice cream (1 cup) / 9 g  Choose: Sherbet (1 cup) / 2.5 g  Choose: Nonfat frozen yogurt (1 cup) / 0.3 g  Choose: Frozen fruit bar / trace  Avoid: Whipped cream (1 tbs) / 3.5 g  Choose: Nondairy whipped topping (1 tbs) / 1 g Condiments / Saturated Fat (g)  Avoid: Mayonnaise (1 tbs) / 2 g  Choose: Low-fat mayonnaise (1 tbs) / 1 g  Avoid: Butter (1 tbs) / 7 g  Choose: Extra light margarine (1 tbs) / 1 g  Avoid: Coconut oil (1 tbs) / 11.8 g  Choose: Olive oil (1 tbs) / 1.8 g  Choose: Corn oil (1 tbs) / 1.7 g  Choose: Safflower oil (1 tbs) / 1.2 g  Choose: Sunflower oil (1 tbs) / 1.4 g  Choose: Soybean oil (1 tbs) / 2.4 g  Choose: Canola oil (1 tbs) / 1 g Document Released: 08/23/2005 Document Revised: 12/18/2012 Document Reviewed: 11/21/2013 Exit[employer] Patient Information 2015 [employer]. This information is not intended to replace advice given to you by your health care provider. Make sure you discuss any questions you have with your health care provider.  

## 2015-02-06 NOTE — Progress Notes (Signed)
Patient ID: Lori Serrano, female   DOB: 1971-08-01, 44 y.o.   MRN: [id number] Presents with complaint of increased discharge with questionable odor, intercourse 1 with condom with ex-husband requesting STD screen. Denies abdominal pain, fever, urinary symptoms. Monthly cycle. Requested lipid screening, fasting.  Exam: Appears well. External genitalia within normal limits, speculum exam scant discharge, no odor noted wet prep negative. GC/Chlamydia culture taken. Bimanual no CMT or adnexal tenderness.  STD screen  Plan: GC/Chlamydia culture pending, HIV, hep B, C, RPR, HSV 1, 2. Lipid screen. Reviewed normality of exam. Reviewed importance of condoms if sexually active. Counseling encouraged for impending divorce.

## 2015-02-07 LAB — URINALYSIS W MICROSCOPIC + REFLEX CULTURE
BILIRUBIN URINE: NEGATIVE
Bacteria, UA: NONE SEEN
Casts: NONE SEEN
Crystals: NONE SEEN
Glucose, UA: NEGATIVE mg/dL
Hgb urine dipstick: NEGATIVE
KETONES UR: NEGATIVE mg/dL
Leukocytes, UA: NEGATIVE
NITRITE: NEGATIVE
PH: 5.5 (ref 5.0–8.0)
PROTEIN: NEGATIVE mg/dL
SQUAMOUS EPITHELIAL / LPF: NONE SEEN
Specific Gravity, Urine: 1.027 (ref 1.005–1.030)
Urobilinogen, UA: 0.2 mg/dL (ref 0.0–1.0)

## 2015-02-07 LAB — GC/CHLAMYDIA PROBE AMP
CT Probe RNA: NEGATIVE
GC Probe RNA: NEGATIVE

## 2015-02-07 LAB — HEPATITIS B SURFACE ANTIGEN: HEP B S AG: NEGATIVE

## 2015-02-07 LAB — HIV ANTIBODY (ROUTINE TESTING W REFLEX): HIV 1&2 Ab, 4th Generation: NONREACTIVE

## 2015-02-07 LAB — HSV(HERPES SIMPLEX VRS) I + II AB-IGG: HSV 2 Glycoprotein G Ab, IgG: <0.1 IV

## 2015-02-07 LAB — RPR

## 2015-02-07 LAB — HEPATITIS C ANTIBODY: HCV Ab: NEGATIVE

## 2015-02-07 NOTE — Progress Notes (Signed)
PT INFORMED WITH THE BELOW

## 2015-12-17 ENCOUNTER — Encounter: Payer: Self-pay | Admitting: *Deleted

## 2015-12-24 ENCOUNTER — Ambulatory Visit (INDEPENDENT_AMBULATORY_CARE_PROVIDER_SITE_OTHER): Payer: PRIVATE HEALTH INSURANCE | Admitting: *Deleted

## 2015-12-24 DIAGNOSIS — I83893 Varicose veins of bilateral lower extremities with other complications: Secondary | ICD-10-CM

## 2015-12-24 NOTE — Progress Notes (Signed)
X=.3% Sotradecol administered with a 27g butterfly.  Patient received a total of 6cc.  Treated as many veins as possible with one syringe. Easy access. Tol well. Anticipate good results. Will follow this nice lady prn.  Photos: No.  Compression stockings applied: Yes.

## 2016-10-29 ENCOUNTER — Encounter: Payer: BLUE CROSS/BLUE SHIELD | Admitting: Women's Health

## 2016-11-26 ENCOUNTER — Encounter: Payer: Self-pay | Admitting: *Deleted

## 2016-12-06 ENCOUNTER — Encounter: Payer: Self-pay | Admitting: *Deleted

## 2016-12-06 ENCOUNTER — Ambulatory Visit (INDEPENDENT_AMBULATORY_CARE_PROVIDER_SITE_OTHER): Payer: Self-pay | Admitting: *Deleted

## 2016-12-06 DIAGNOSIS — I83893 Varicose veins of bilateral lower extremities with other complications: Secondary | ICD-10-CM

## 2016-12-06 NOTE — Progress Notes (Signed)
X=.3% Sotradecol administered with a 27g butterfly.  Patient received a total of 6cc.  She has reflux in both SSV's so agreed to try one syringe on vessels that hurt. Easy access. Tol very well. Will follow this sweet lady prn.   Compression stockings applied: Yes.

## 2016-12-08 ENCOUNTER — Ambulatory Visit: Payer: PRIVATE HEALTH INSURANCE | Admitting: *Deleted

## 2016-12-08 ENCOUNTER — Encounter: Payer: Self-pay | Admitting: Women's Health

## 2016-12-08 ENCOUNTER — Ambulatory Visit (INDEPENDENT_AMBULATORY_CARE_PROVIDER_SITE_OTHER): Payer: BLUE CROSS/BLUE SHIELD | Admitting: Women's Health

## 2016-12-08 VITALS — BP 110/72 | Ht 64.0 in | Wt 146.0 lb

## 2016-12-08 DIAGNOSIS — Z1151 Encounter for screening for human papillomavirus (HPV): Secondary | ICD-10-CM

## 2016-12-08 DIAGNOSIS — Z01419 Encounter for gynecological examination (general) (routine) without abnormal findings: Secondary | ICD-10-CM | POA: Diagnosis not present

## 2016-12-08 DIAGNOSIS — Z13220 Encounter for screening for lipoid disorders: Secondary | ICD-10-CM | POA: Diagnosis not present

## 2016-12-08 LAB — LIPID PANEL
CHOL/HDL RATIO: 4.2 ratio (ref <5.0)
Cholesterol: 234 mg/dL — ABNORMAL HIGH (ref <200)
HDL: 56 mg/dL (ref >50)
LDL Cholesterol: 155 mg/dL — ABNORMAL HIGH (ref <100)
Triglycerides: 115 mg/dL (ref <150)
VLDL: 23 mg/dL (ref <30)

## 2016-12-08 LAB — CBC WITH DIFFERENTIAL/PLATELET
BASOS ABS: 0 {cells}/uL (ref 0–200)
Basophils Relative: 0 %
Eosinophils Absolute: 96 cells/uL (ref 15–500)
Eosinophils Relative: 1 %
HCT: 40.4 % (ref 35.0–45.0)
Hemoglobin: 13.8 g/dL (ref 11.7–15.5)
LYMPHS PCT: 31 %
Lymphs Abs: 2976 cells/uL (ref 850–3900)
MCH: 29.6 pg (ref 27.0–33.0)
MCHC: 34.2 g/dL (ref 32.0–36.0)
MCV: 86.7 fL (ref 80.0–100.0)
MPV: 9.2 fL (ref 7.5–12.5)
Monocytes Absolute: 768 cells/uL (ref 200–950)
Monocytes Relative: 8 %
Neutro Abs: 5760 cells/uL (ref 1500–7800)
Neutrophils Relative %: 60 %
PLATELETS: 359 10*3/uL (ref 140–400)
RBC: 4.66 MIL/uL (ref 3.80–5.10)
RDW: 13.6 % (ref 11.0–15.0)
WBC: 9.6 10*3/uL (ref 3.8–10.8)

## 2016-12-08 LAB — COMPREHENSIVE METABOLIC PANEL
ALT: 12 U/L (ref 6–29)
AST: 16 U/L (ref 10–35)
Albumin: 4.7 g/dL (ref 3.6–5.1)
Alkaline Phosphatase: 73 U/L (ref 33–115)
BUN: 7 mg/dL (ref 7–25)
CHLORIDE: 102 mmol/L (ref 98–110)
CO2: 22 mmol/L (ref 20–31)
Calcium: 10 mg/dL (ref 8.6–10.2)
Creat: 0.6 mg/dL (ref 0.50–1.10)
GLUCOSE: 94 mg/dL (ref 65–99)
POTASSIUM: 3.9 mmol/L (ref 3.5–5.3)
Sodium: 137 mmol/L (ref 135–146)
Total Bilirubin: 0.5 mg/dL (ref 0.2–1.2)
Total Protein: 7.5 g/dL (ref 6.1–8.1)

## 2016-12-08 NOTE — Patient Instructions (Signed)
Health Maintenance, Female Adopting a healthy lifestyle and getting preventive care can go a long way to promote health and wellness. Talk with your health care provider about what schedule of regular examinations is right for you. This is a good chance for you to check in with your provider about disease prevention and staying healthy. In between checkups, there are plenty of things you can do on your own. Experts have done a lot of research about which lifestyle changes and preventive measures are most likely to keep you healthy. Ask your health care provider for more information. Weight and diet Eat a healthy diet  Be sure to include plenty of vegetables, fruits, low-fat dairy products, and lean protein.  Do not eat a lot of foods high in solid fats, added sugars, or salt.  Get regular exercise. This is one of the most important things you can do for your health.  Most adults should exercise for at least 150 minutes each week. The exercise should increase your heart rate and make you sweat (moderate-intensity exercise).  Most adults should also do strengthening exercises at least twice a week. This is in addition to the moderate-intensity exercise. Maintain a healthy weight  Body mass index (BMI) is a measurement that can be used to identify possible weight problems. It estimates body fat based on height and weight. Your health care provider can help determine your BMI and help you achieve or maintain a healthy weight.  For females 76 years of age and older:  A BMI below 18.5 is considered underweight.  A BMI of 18.5 to 24.9 is normal.  A BMI of 25 to 29.9 is considered overweight.  A BMI of 30 and above is considered obese. Watch levels of cholesterol and blood lipids  You should start having your blood tested for lipids and cholesterol at 47 years of age, then have this test every 5 years.  You may need to have your cholesterol levels checked more often if:  Your lipid or  cholesterol levels are high.  You are older than 47 years of age.  You are at high risk for heart disease. Cancer screening Lung Cancer  Lung cancer screening is recommended for adults 64-42 years old who are at high risk for lung cancer because of a history of smoking.  A yearly low-dose CT scan of the lungs is recommended for people who:  Currently smoke.  Have quit within the past 15 years.  Have at least a 30-pack-year history of smoking. A pack year is smoking an average of one pack of cigarettes a day for 1 year.  Yearly screening should continue until it has been 15 years since you quit.  Yearly screening should stop if you develop a health problem that would prevent you from having lung cancer treatment. Breast Cancer  Practice breast self-awareness. This means understanding how your breasts normally appear and feel.  It also means doing regular breast self-exams. Let your health care provider know about any changes, no matter how small.  If you are in your 20s or 30s, you should have a clinical breast exam (CBE) by a health care provider every 1-3 years as part of a regular health exam.  If you are 34 or older, have a CBE every year. Also consider having a breast X-ray (mammogram) every year.  If you have a family history of breast cancer, talk to your health care provider about genetic screening.  If you are at high risk for breast cancer, talk  to your health care provider about having an MRI and a mammogram every year.  Breast cancer gene (BRCA) assessment is recommended for women who have family members with BRCA-related cancers. BRCA-related cancers include:  Breast.  Ovarian.  Tubal.  Peritoneal cancers.  Results of the assessment will determine the need for genetic counseling and BRCA1 and BRCA2 testing. Cervical Cancer  Your health care provider may recommend that you be screened regularly for cancer of the pelvic organs (ovaries, uterus, and vagina).  This screening involves a pelvic examination, including checking for microscopic changes to the surface of your cervix (Pap test). You may be encouraged to have this screening done every 3 years, beginning at age 24.  For women ages 66-65, health care providers may recommend pelvic exams and Pap testing every 3 years, or they may recommend the Pap and pelvic exam, combined with testing for human papilloma virus (HPV), every 5 years. Some types of HPV increase your risk of cervical cancer. Testing for HPV may also be done on women of any age with unclear Pap test results.  Other health care providers may not recommend any screening for nonpregnant women who are considered low risk for pelvic cancer and who do not have symptoms. Ask your health care provider if a screening pelvic exam is right for you.  If you have had past treatment for cervical cancer or a condition that could lead to cancer, you need Pap tests and screening for cancer for at least 20 years after your treatment. If Pap tests have been discontinued, your risk factors (such as having a new sexual partner) need to be reassessed to determine if screening should resume. Some women have medical problems that increase the chance of getting cervical cancer. In these cases, your health care provider may recommend more frequent screening and Pap tests. Colorectal Cancer  This type of cancer can be detected and often prevented.  Routine colorectal cancer screening usually begins at 47 years of age and continues through 47 years of age.  Your health care provider may recommend screening at an earlier age if you have risk factors for colon cancer.  Your health care provider may also recommend using home test kits to check for hidden blood in the stool.  A small camera at the end of a tube can be used to examine your colon directly (sigmoidoscopy or colonoscopy). This is done to check for the earliest forms of colorectal cancer.  Routine  screening usually begins at age 41.  Direct examination of the colon should be repeated every 5-10 years through 47 years of age. However, you may need to be screened more often if early forms of precancerous polyps or small growths are found. Skin Cancer  Check your skin from head to toe regularly.  Tell your health care provider about any new moles or changes in moles, especially if there is a change in a mole's shape or color.  Also tell your health care provider if you have a mole that is larger than the size of a pencil eraser.  Always use sunscreen. Apply sunscreen liberally and repeatedly throughout the day.  Protect yourself by wearing long sleeves, pants, a wide-brimmed hat, and sunglasses whenever you are outside. Heart disease, diabetes, and high blood pressure  High blood pressure causes heart disease and increases the risk of stroke. High blood pressure is more likely to develop in:  People who have blood pressure in the high end of the normal range (130-139/85-89 mm Hg).  People who are overweight or obese.  People who are African American.  If you are 59-24 years of age, have your blood pressure checked every 3-5 years. If you are 34 years of age or older, have your blood pressure checked every year. You should have your blood pressure measured twice-once when you are at a hospital or clinic, and once when you are not at a hospital or clinic. Record the average of the two measurements. To check your blood pressure when you are not at a hospital or clinic, you can use:  An automated blood pressure machine at a pharmacy.  A home blood pressure monitor.  If you are between 29 years and 60 years old, ask your health care provider if you should take aspirin to prevent strokes.  Have regular diabetes screenings. This involves taking a blood sample to check your fasting blood sugar level.  If you are at a normal weight and have a low risk for diabetes, have this test once  every three years after 47 years of age.  If you are overweight and have a high risk for diabetes, consider being tested at a younger age or more often. Preventing infection Hepatitis B  If you have a higher risk for hepatitis B, you should be screened for this virus. You are considered at high risk for hepatitis B if:  You were born in a country where hepatitis B is common. Ask your health care provider which countries are considered high risk.  Your parents were born in a high-risk country, and you have not been immunized against hepatitis B (hepatitis B vaccine).  You have HIV or AIDS.  You use needles to inject street drugs.  You live with someone who has hepatitis B.  You have had sex with someone who has hepatitis B.  You get hemodialysis treatment.  You take certain medicines for conditions, including cancer, organ transplantation, and autoimmune conditions. Hepatitis C  Blood testing is recommended for:  Everyone born from [birth year] through 1966.  Anyone with known risk factors for hepatitis C. Sexually transmitted infections (STIs)  You should be screened for sexually transmitted infections (STIs) including gonorrhea and chlamydia if:  You are sexually active and are younger than 47 years of age.  You are older than 47 years of age and your health care provider tells you that you are at risk for this type of infection.  Your sexual activity has changed since you were last screened and you are at an increased risk for chlamydia or gonorrhea. Ask your health care provider if you are at risk.  If you do not have HIV, but are at risk, it may be recommended that you take a prescription medicine daily to prevent HIV infection. This is called pre-exposure prophylaxis (PrEP). You are considered at risk if:  You are sexually active and do not regularly use condoms or know the HIV status of your partner(s).  You take drugs by injection.  You are sexually active with a partner  who has HIV. Talk with your health care provider about whether you are at high risk of being infected with HIV. If you choose to begin PrEP, you should first be tested for HIV. You should then be tested every 3 months for as long as you are taking PrEP. Pregnancy  If you are premenopausal and you may become pregnant, ask your health care provider about preconception counseling.  If you may become pregnant, take 400 to 800 micrograms (mcg) of folic acid  every day.  If you want to prevent pregnancy, talk to your health care provider about birth control (contraception). Osteoporosis and menopause  Osteoporosis is a disease in which the bones lose minerals and strength with aging. This can result in serious bone fractures. Your risk for osteoporosis can be identified using a bone density scan.  If you are 4 years of age or older, or if you are at risk for osteoporosis and fractures, ask your health care provider if you should be screened.  Ask your health care provider whether you should take a calcium or vitamin D supplement to lower your risk for osteoporosis.  Menopause may have certain physical symptoms and risks.  Hormone replacement therapy may reduce some of these symptoms and risks. Talk to your health care provider about whether hormone replacement therapy is right for you. Follow these instructions at home:  Schedule regular health, dental, and eye exams.  Stay current with your immunizations.  Do not use any tobacco products including cigarettes, chewing tobacco, or electronic cigarettes.  If you are pregnant, do not drink alcohol.  If you are breastfeeding, limit how much and how often you drink alcohol.  Limit alcohol intake to no more than 1 drink per day for nonpregnant women. One drink equals 12 ounces of beer, 5 ounces of wine, or 1 ounces of hard liquor.  Do not use street drugs.  Do not share needles.  Ask your health care provider for help if you need support  or information about quitting drugs.  Tell your health care provider if you often feel depressed.  Tell your health care provider if you have ever been abused or do not feel safe at home. This information is not intended to replace advice given to you by your health care provider. Make sure you discuss any questions you have with your health care provider. Document Released: 03/08/2011 Document Revised: 01/29/2016 Document Reviewed: 05/27/2015 Elsevier Interactive Patient Education  2017 [employer].

## 2016-12-08 NOTE — Progress Notes (Signed)
Lori Serrano Feb 12, 1971 [id number]    History:    Presents for annual exam with complaints of painful red bumps on buttock for past several days. Regular monthly cycle/not sexually active years. Normal pap and mammogram history. Sister with breast cancer, BRCA status unknown. Maternal grandmother, paternal grandmother breast cancer after menopause. Lap surgery 92, 93, 98 for endometriosis and endometrioma. Sclerotherapy 2018 for varicose veins. History of anxiety and depression denies need for medication at this time.  Past medical history, past surgical history, family history and social history were all reviewed and documented in the EPIC chart. Has not dated since divorce. Has two children, son 60 truck driver, daughter 14 in high school. Daughter had retinoblastoma as a 69-monthold but doing well now. Parents heart disease and hypertension.   ROS:  A ROS was performed and pertinent positives and negatives are included.  Exam:  Vitals:   12/08/16 1441  BP: 110/72  Weight: 146 lb (66.2 kg)  Height: '5\' 4"'  (1.626 m)   Body mass index is 25.06 kg/m.   General appearance:  Normal Thyroid:  Symmetrical, normal in size, without palpable masses or nodularity. Respiratory  Auscultation:  Clear without wheezing or rhonchi Cardiovascular  Auscultation:  Regular rate, without rubs, murmurs or gallops  Edema/varicosities:  Not grossly evident Abdominal  Soft,nontender, without masses, guarding or rebound.  Liver/spleen:  No organomegaly noted  Hernia:  None appreciated  Skin  Inspection:  Grossly normal   Breasts: Examined lying and sitting.     Right: Without masses, retractions, discharge or axillary adenopathy.     Left: Without masses, retractions, discharge or axillary adenopathy. Gentitourinary   Inguinal/mons:  Normal without inguinal adenopathy  External genitalia:  Normal  BUS/Urethra/Skene's glands:  Normal  Vagina:  Normal  Cervix:  Normal  Uterus:  Normal in size, shape  and contour.  Midline and mobile  Adnexa/parametria:     Rt: Without masses or tenderness.   Lt: Without masses or tenderness.  Anus and perineum: 2 small Molluscum contagiosum rt perineum, 1cm folliculitis left perineum  Digital rectal exam: Normal sphincter tone without palpated masses or tenderness  Assessment/Plan:  46y.o. 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 Reassured that bumps were molluscum contagiosum and self limiting. Neosporin to area folliculitis, loose clothing change of exercise clothing quickly. SBE's, continue regular exercise/walking, calcium rich diet, vitamin D 1000 daily encouraged. CBC, CMP, lipid panel. Pap with HR HPV typing, new screening guidelines reviewed..Marland Kitchen     YAllen 3:14 PM 12/08/2016

## 2016-12-10 LAB — PAP, TP IMAGING W/ HPV RNA, RFLX HPV TYPE 16,18/45: HPV mRNA, High Risk: NOT DETECTED

## 2016-12-29 ENCOUNTER — Other Ambulatory Visit: Payer: Self-pay | Admitting: Women's Health

## 2016-12-29 ENCOUNTER — Telehealth: Payer: Self-pay | Admitting: *Deleted

## 2016-12-29 DIAGNOSIS — F32A Depression, unspecified: Secondary | ICD-10-CM

## 2016-12-29 DIAGNOSIS — F329 Major depressive disorder, single episode, unspecified: Secondary | ICD-10-CM

## 2016-12-29 DIAGNOSIS — F419 Anxiety disorder, unspecified: Principal | ICD-10-CM

## 2016-12-29 MED ORDER — SERTRALINE HCL 50 MG PO TABS: 50.0000 mg | ORAL_TABLET | Freq: Every day | ORAL | 12 refills | Status: DC

## 2016-12-29 NOTE — Telephone Encounter (Signed)
Pt was seen for annual exam on 12/08/16 mention to you about possibility starting on Zoloft, pt declined at the time, now would like to start on Rx. I didn't see any documentation in annual note. Please advise

## 2016-12-29 NOTE — Telephone Encounter (Signed)
TC-more tearful, down and out, off zoloft for past year.  Will keep scheduled counseling appointment, will start with half tablet of Zoloft 50 mg for 2 weeks and then increase to full tablet Rx E scribed. Reviewed importance of self-care, exercise, leisure activities. Denies any feelings of harm self or others.

## 2017-01-11 ENCOUNTER — Other Ambulatory Visit: Payer: Self-pay | Admitting: Women's Health

## 2017-01-11 DIAGNOSIS — Z1231 Encounter for screening mammogram for malignant neoplasm of breast: Secondary | ICD-10-CM

## 2017-01-19 ENCOUNTER — Encounter: Payer: Self-pay | Admitting: Gynecology

## 2017-02-04 ENCOUNTER — Encounter (INDEPENDENT_AMBULATORY_CARE_PROVIDER_SITE_OTHER): Payer: Self-pay

## 2017-02-04 ENCOUNTER — Ambulatory Visit
Admission: RE | Admit: 2017-02-04 | Discharge: 2017-02-04 | Disposition: A | Discharge status: Home / Self Care | Payer: BLUE CROSS/BLUE SHIELD | Source: Ambulatory Visit | Attending: Women's Health | Admitting: Women's Health

## 2017-02-04 DIAGNOSIS — Z1231 Encounter for screening mammogram for malignant neoplasm of breast: Secondary | ICD-10-CM

## 2017-11-03 ENCOUNTER — Ambulatory Visit (INDEPENDENT_AMBULATORY_CARE_PROVIDER_SITE_OTHER): Payer: Self-pay | Admitting: *Deleted

## 2017-11-03 DIAGNOSIS — I781 Nevus, non-neoplastic: Secondary | ICD-10-CM

## 2017-11-03 NOTE — Progress Notes (Signed)
X=.3% Sotradecol administered with a 27g butterfly.  Patient received a total of 6cc.  Cutaneous Laser:pulsed mode  810j/cm2 400 ms delay  13 ms Duration 0.5 spot  Total pulses: 643 Total energy 1.014  Total time::08  Photos: No.  Compression stockings applied: Yes.     Injected areas that are new and painful. Tol well. Also used the CL on smaller vessels. Will follow prn.

## 2017-12-14 ENCOUNTER — Ambulatory Visit: Payer: BLUE CROSS/BLUE SHIELD | Admitting: *Deleted

## 2018-01-01 ENCOUNTER — Other Ambulatory Visit: Payer: Self-pay | Admitting: Women's Health

## 2018-01-01 DIAGNOSIS — F419 Anxiety disorder, unspecified: Principal | ICD-10-CM

## 2018-01-01 DIAGNOSIS — F329 Major depressive disorder, single episode, unspecified: Secondary | ICD-10-CM

## 2018-01-01 DIAGNOSIS — F32A Depression, unspecified: Secondary | ICD-10-CM

## 2018-01-02 ENCOUNTER — Telehealth: Payer: Self-pay | Admitting: *Deleted

## 2018-01-02 DIAGNOSIS — F419 Anxiety disorder, unspecified: Principal | ICD-10-CM

## 2018-01-02 DIAGNOSIS — F329 Major depressive disorder, single episode, unspecified: Secondary | ICD-10-CM

## 2018-01-02 MED ORDER — SERTRALINE HCL 50 MG PO TABS: 50.0000 mg | ORAL_TABLET | Freq: Every day | ORAL | 2 refills | Status: DC

## 2018-01-02 NOTE — Telephone Encounter (Signed)
(  you are back up MD) Patient has annual exam scheduled on 03/06/18, needs refill on Zoloft 50 mg tablet, okay to refill until Jan?

## 2018-01-02 NOTE — Telephone Encounter (Signed)
Okay to refill through July 2019 office annual

## 2018-01-02 NOTE — Telephone Encounter (Signed)
Pt informed, Rx sent. 

## 2018-01-24 ENCOUNTER — Other Ambulatory Visit: Payer: Self-pay | Admitting: Gynecology

## 2018-01-24 DIAGNOSIS — F419 Anxiety disorder, unspecified: Principal | ICD-10-CM

## 2018-01-24 DIAGNOSIS — F329 Major depressive disorder, single episode, unspecified: Secondary | ICD-10-CM

## 2018-03-06 ENCOUNTER — Encounter: Payer: BLUE CROSS/BLUE SHIELD | Admitting: Women's Health

## 2018-12-25 IMAGING — MG 2D DIGITAL SCREENING BILATERAL MAMMOGRAM WITH CAD AND ADJUNCT TO
8 of 12 series · 8 of 28 positions shown · non-contrast
Comparison: Previous exam(s).

CLINICAL DATA: Screening.

EXAM:
2D DIGITAL SCREENING BILATERAL MAMMOGRAM WITH CAD AND ADJUNCT TOMO

[L MLO]
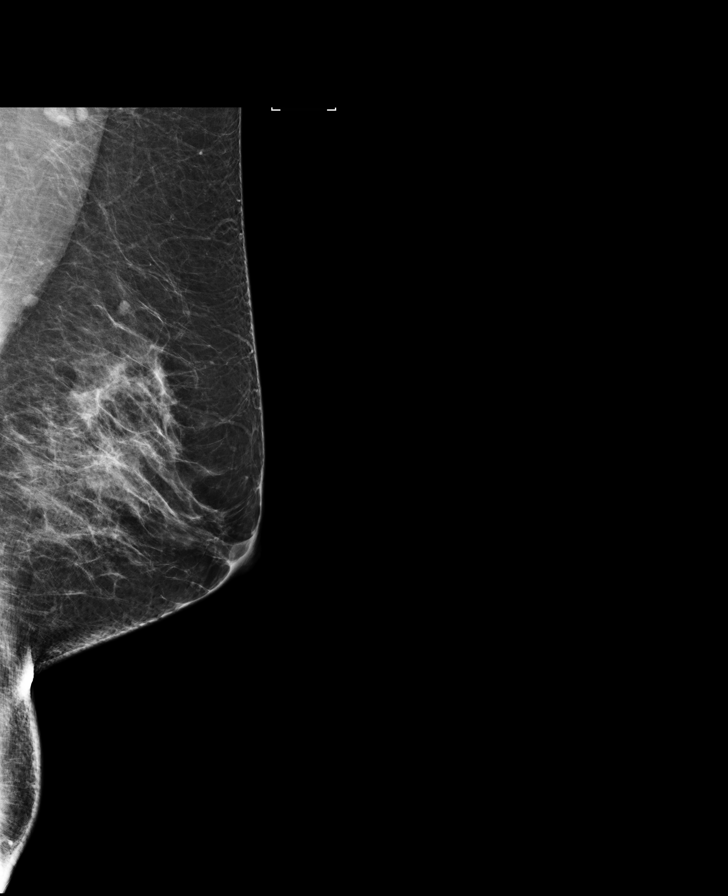

[L CC synth-2D]
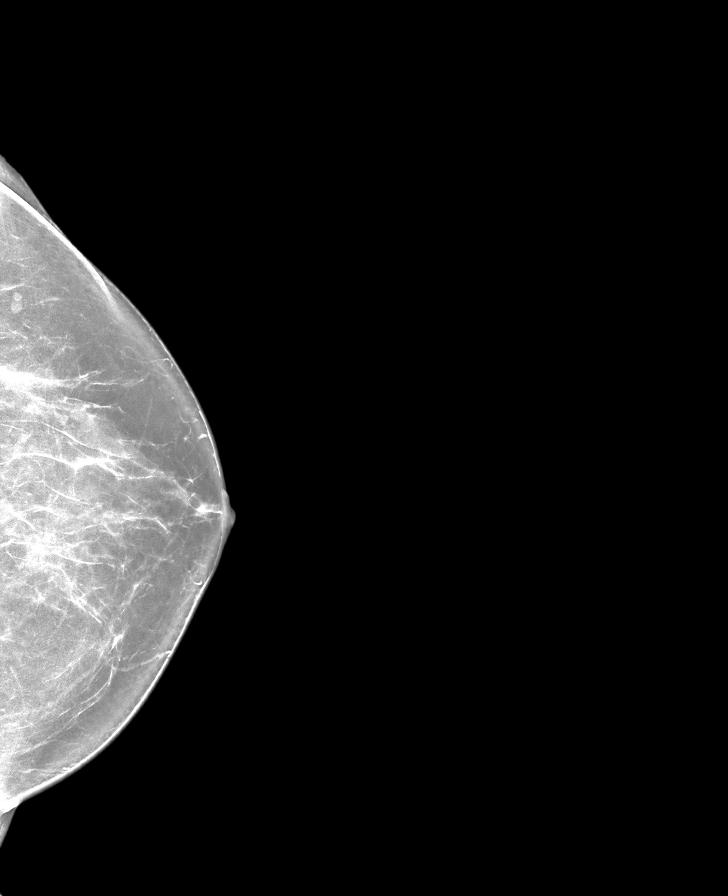

[L MLO synth-2D]
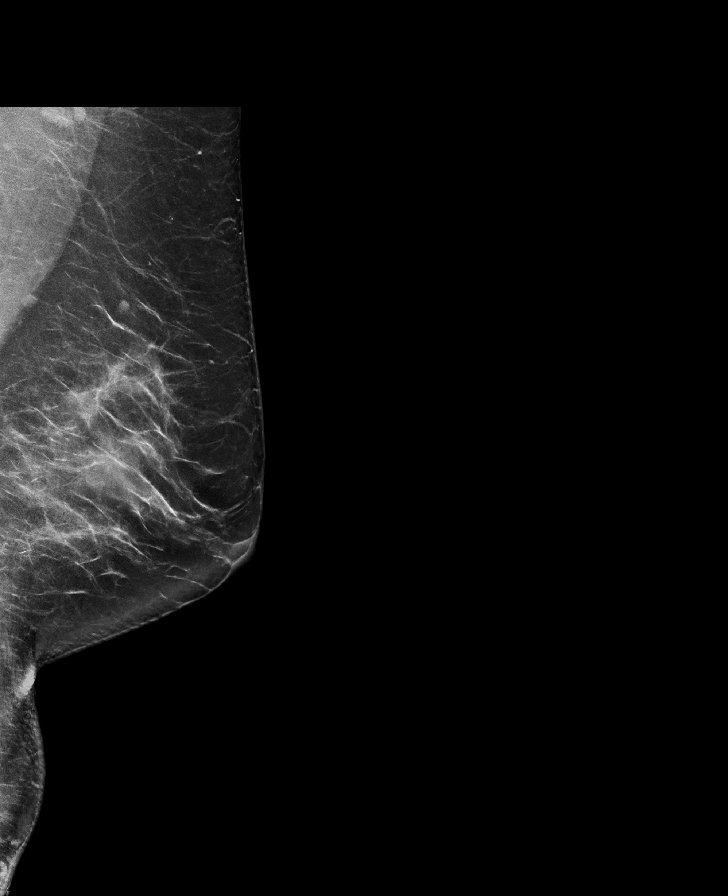

[R CC]
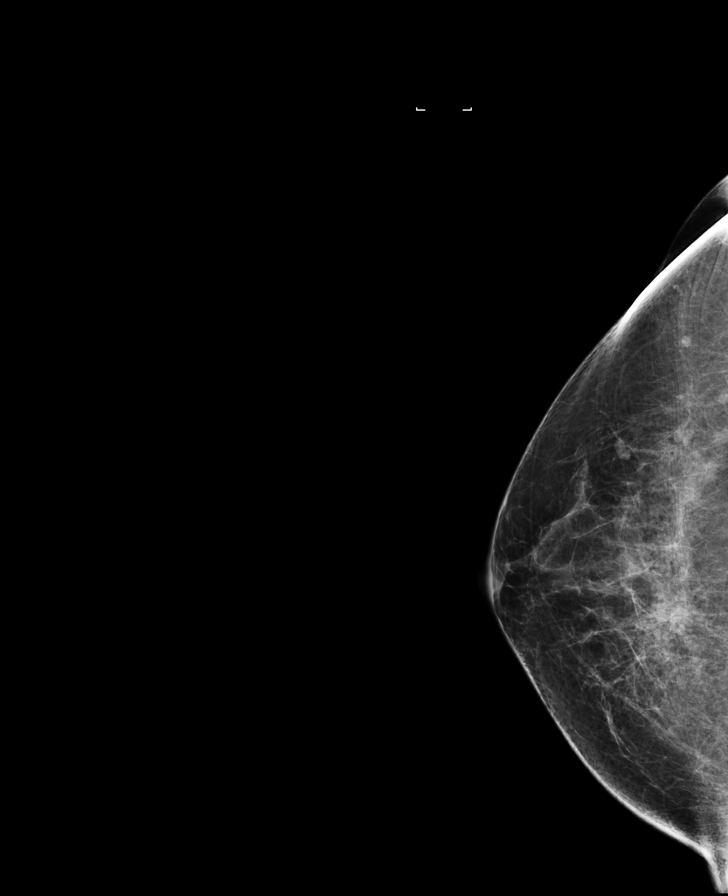

[R CC synth-2D]
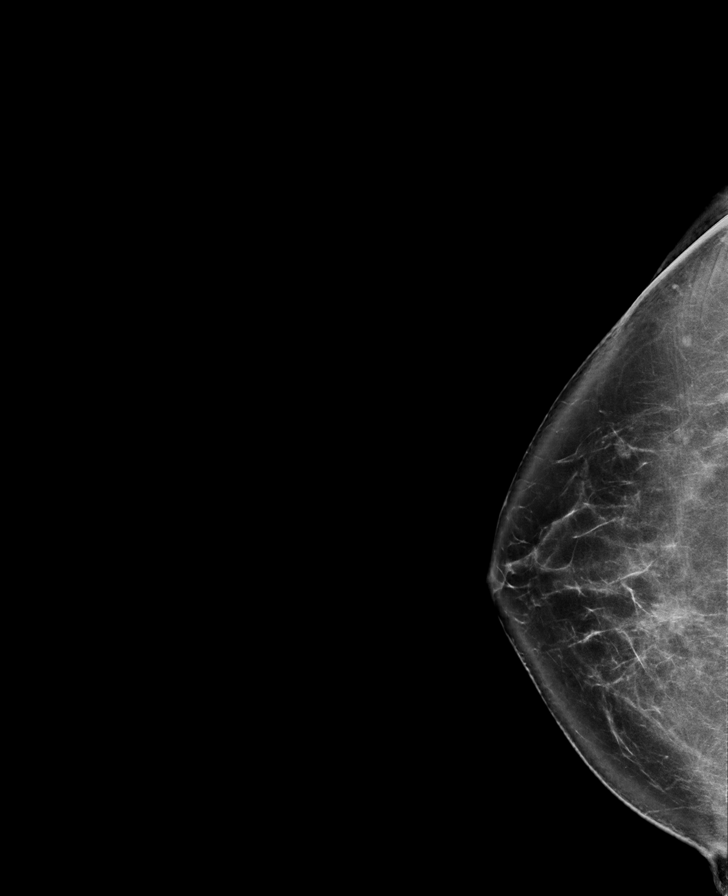

[R MLO]
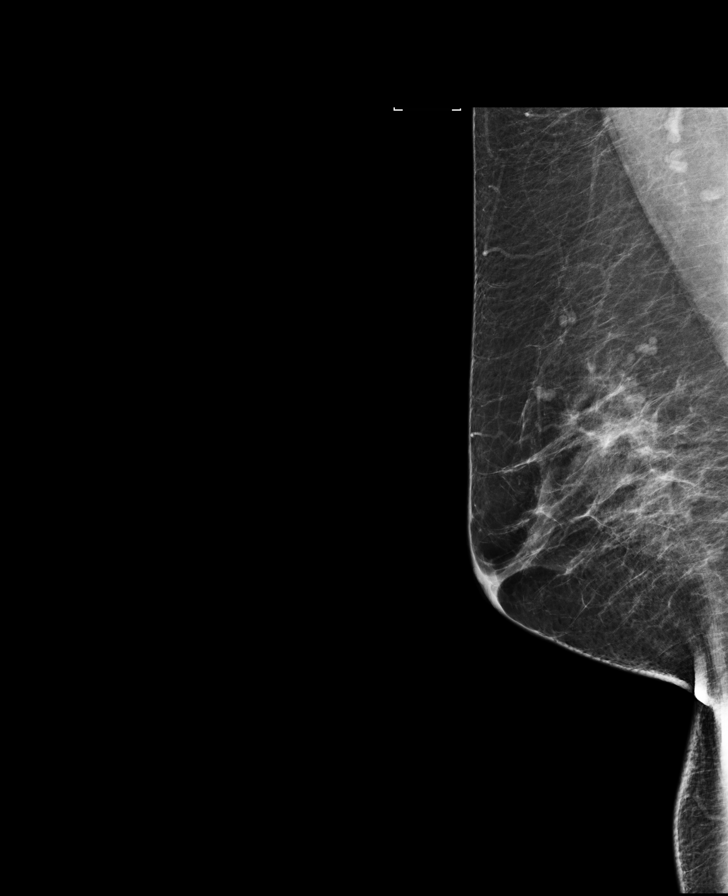

[L CC]
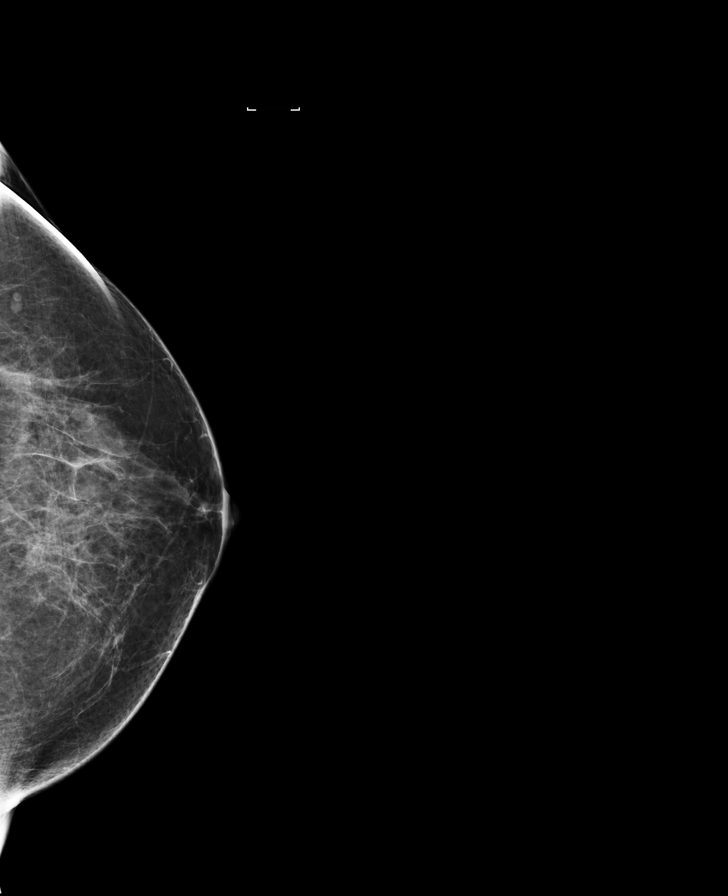

[R MLO synth-2D]
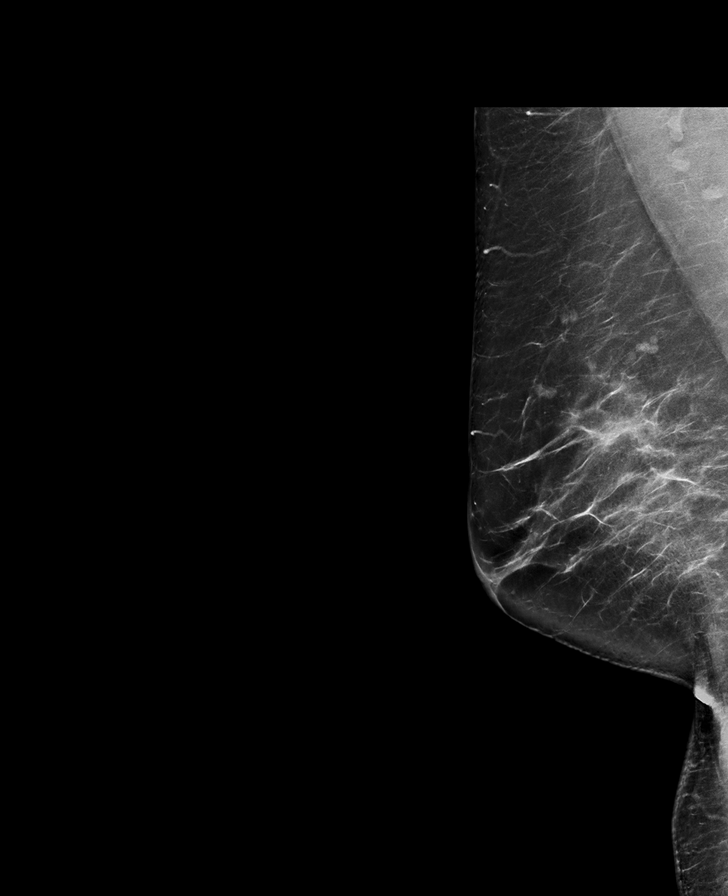

[8 of 28 positions shown; findings below may reference images not displayed]

ACR Breast Density Category b: There are scattered areas of
fibroglandular density.
FINDINGS: There are no findings suspicious for malignancy. Images were
processed with CAD.
IMPRESSION: No mammographic evidence of malignancy. A result letter of this
screening mammogram will be mailed directly to the patient.

RECOMMENDATION:
Screening mammogram in one year. (Code:97-6-RS4)

BI-RADS CATEGORY  1: Negative.

## 2019-05-30 ENCOUNTER — Encounter: Payer: Self-pay | Admitting: Gynecology

## 2024-02-19 DIAGNOSIS — R079 Chest pain, unspecified: Secondary | ICD-10-CM | POA: Diagnosis not present

## 2024-02-24 ENCOUNTER — Encounter: Payer: Self-pay | Admitting: Specialist
